# Patient Record
Sex: Female | Born: 1960
Health system: Southern US, Community
[De-identification: ages and names within clinical notes are randomized; demographics above are authoritative.]

## PROBLEM LIST (undated history)

## (undated) DIAGNOSIS — R112 Nausea with vomiting, unspecified: Secondary | ICD-10-CM

## (undated) DIAGNOSIS — T7840XA Allergy, unspecified, initial encounter: Secondary | ICD-10-CM

## (undated) DIAGNOSIS — Z86010 Personal history of colonic polyps: Secondary | ICD-10-CM

## (undated) DIAGNOSIS — F419 Anxiety disorder, unspecified: Secondary | ICD-10-CM

## (undated) DIAGNOSIS — Z9889 Other specified postprocedural states: Secondary | ICD-10-CM

## (undated) DIAGNOSIS — T8859XA Other complications of anesthesia, initial encounter: Secondary | ICD-10-CM

## (undated) DIAGNOSIS — K219 Gastro-esophageal reflux disease without esophagitis: Secondary | ICD-10-CM

## (undated) DIAGNOSIS — T4145XA Adverse effect of unspecified anesthetic, initial encounter: Secondary | ICD-10-CM

## (undated) DIAGNOSIS — Z800 Family history of malignant neoplasm of digestive organs: Secondary | ICD-10-CM

## (undated) HISTORY — DX: Anxiety disorder, unspecified: F41.9

## (undated) HISTORY — DX: Gastro-esophageal reflux disease without esophagitis: K21.9

## (undated) HISTORY — DX: Family history of malignant neoplasm of digestive organs: Z80.0

## (undated) HISTORY — DX: Allergy, unspecified, initial encounter: T78.40XA

## (undated) HISTORY — PX: NASAL SINUS SURGERY: SHX719

## (undated) HISTORY — DX: Personal history of colonic polyps: Z86.010

## (undated) HISTORY — PX: COLONOSCOPY: SHX174

---
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
## 1998-07-30 ENCOUNTER — Other Ambulatory Visit: Admission: RE | Admit: 1998-07-30 | Discharge: 1998-07-30 | Payer: Self-pay | Admitting: *Deleted

## 1998-07-30 ENCOUNTER — Encounter (INDEPENDENT_AMBULATORY_CARE_PROVIDER_SITE_OTHER): Payer: Self-pay | Admitting: Specialist

## 1998-12-24 ENCOUNTER — Other Ambulatory Visit: Admission: RE | Admit: 1998-12-24 | Discharge: 1998-12-24 | Payer: Self-pay | Admitting: *Deleted

## 1999-12-30 ENCOUNTER — Other Ambulatory Visit: Admission: RE | Admit: 1999-12-30 | Discharge: 1999-12-30 | Payer: Self-pay | Admitting: *Deleted

## 2000-12-31 ENCOUNTER — Other Ambulatory Visit: Admission: RE | Admit: 2000-12-31 | Discharge: 2000-12-31 | Payer: Self-pay | Admitting: *Deleted

## 2001-02-23 ENCOUNTER — Encounter: Admission: RE | Admit: 2001-02-23 | Discharge: 2001-02-23 | Payer: Self-pay | Admitting: *Deleted

## 2001-02-23 ENCOUNTER — Encounter: Payer: Self-pay | Admitting: *Deleted

## 2002-01-17 ENCOUNTER — Other Ambulatory Visit: Admission: RE | Admit: 2002-01-17 | Discharge: 2002-01-17 | Payer: Self-pay | Admitting: Obstetrics and Gynecology

## 2003-01-29 ENCOUNTER — Other Ambulatory Visit: Admission: RE | Admit: 2003-01-29 | Discharge: 2003-01-29 | Payer: Self-pay | Admitting: Obstetrics and Gynecology

## 2006-09-10 LAB — CONVERTED CEMR LAB: Pap Smear: NORMAL

## 2006-10-20 ENCOUNTER — Ambulatory Visit: Payer: Self-pay | Admitting: Family Medicine

## 2006-11-17 ENCOUNTER — Ambulatory Visit: Payer: Self-pay | Admitting: Gastroenterology

## 2007-03-14 ENCOUNTER — Ambulatory Visit: Payer: Self-pay | Admitting: Gastroenterology

## 2007-03-25 ENCOUNTER — Ambulatory Visit: Payer: Self-pay | Admitting: Gastroenterology

## 2007-04-21 DIAGNOSIS — J309 Allergic rhinitis, unspecified: Secondary | ICD-10-CM | POA: Insufficient documentation

## 2007-04-21 DIAGNOSIS — K648 Other hemorrhoids: Secondary | ICD-10-CM | POA: Insufficient documentation

## 2010-01-14 ENCOUNTER — Encounter
Admission: RE | Admit: 2010-01-14 | Discharge: 2010-02-04 | Payer: Self-pay | Source: Home / Self Care | Attending: Sports Medicine | Admitting: Sports Medicine

## 2010-02-04 ENCOUNTER — Encounter
Admission: RE | Admit: 2010-02-04 | Discharge: 2010-03-03 | Payer: Self-pay | Source: Home / Self Care | Attending: Sports Medicine | Admitting: Sports Medicine

## 2010-04-21 ENCOUNTER — Encounter: Payer: BC Managed Care – PPO | Admitting: Genetic Counselor

## 2010-06-24 NOTE — Assessment & Plan Note (Signed)
[facility]                         GASTROENTEROLOGY OFFICE NOTE   Lindsay Blair, Lindsay Blair                      MRN:          [id number]  DATE:11/17/2006                            DOB:          January 26, 1961    PHYSICIAN REQUESTING CONSULT:  Dr. Leanne Blair.   REASON FOR CONSULTATION:  First degree relative with colon cancer.   HISTORY OF PRESENT ILLNESS:  Lindsay Blair is a very nice 50 year old  white female referred through the courtesy of Dr. Leanne Blair. Her  gynecologist is Dr. Maxie Better. The patient's father developed  colon cancer at age 48 and passed away at age 18. Her mother developed  colon polyps in her late 43s. No other family members with colon cancer,  colon polyps, or inflammatory bowel disease. The patient relates long  term problems with intermittent mild diarrhea without rectal bleeding.  She has had no change in this chronic bowel pattern. She notes no change  in stool caliber, abdominal pain, rectal pain, weight loss, melena, or  hematochezia. She relates a history of intestinal gas and bloating in  the distant past. She has not had colonoscopy.   PAST MEDICAL HISTORY:  Allergic rhinitis  Status post sinus surgery.   CURRENT MEDICATIONS:  1. Claritin p.r.n.  2. Flonase p.r.n.  3. Allergy injections weekly.   ALLERGIES:  None known.   SOCIAL HISTORY:  Per the handwritten form.   REVIEW OF SYSTEMS:  Per the handwritten from.   PHYSICAL EXAMINATION:  GENERAL:  Well developed, well nourished white  female in no acute distress.  VITAL SIGNS:  Height 5 feet 4.5 inches, weight 150.2 pounds, blood  pressure 98/50, pulse 72 and regular.  HEENT:  Anicteric sclerae, oropharynx clear.  CHEST:  Clear to auscultation bilaterally.  CARDIAC:  Regular rate and rhythm without murmurs appreciated.  ABDOMEN:  Soft and nontender, nondistended, normal active bowel sounds.  No palpable organomegaly, masses, or hernias.  RECTAL:  Deferred to time of colonoscopy.  EXTREMITIES:  Without cyanosis, clubbing, or edema.  NEUROLOGIC:  Alert and oriented x3. Grossly non-focal.   ASSESSMENT AND PLAN:  Father with colon cancer at age 66. Mother with  colon polyps in her late 49s. Rule out colorectal neoplasms. Risks,  benefits, and alternatives to colonoscopy with possible biopsy and  possible polypectomy discussed with the patient, she consents to  proceed. This will be scheduled electively.     Venita Lick. Russella Dar, MD, [facility]  Electronically Signed    MTS/MedQ  DD: 11/17/2006  DT: 11/18/2006  Job #: [id number]   cc:   Lindsay Blair, M.D.

## 2012-02-29 ENCOUNTER — Encounter: Payer: Self-pay | Admitting: Gastroenterology

## 2012-03-03 ENCOUNTER — Encounter: Payer: Self-pay | Admitting: Gastroenterology

## 2012-06-16 ENCOUNTER — Encounter: Payer: Self-pay | Admitting: Gastroenterology

## 2012-08-09 ENCOUNTER — Ambulatory Visit (AMBULATORY_SURGERY_CENTER): Payer: BC Managed Care – PPO

## 2012-08-09 VITALS — Ht 64.5 in | Wt 167.2 lb

## 2012-08-09 DIAGNOSIS — Z800 Family history of malignant neoplasm of digestive organs: Secondary | ICD-10-CM

## 2012-08-09 MED ORDER — MOVIPREP 100 G PO SOLR: ORAL | Status: DC

## 2012-08-10 ENCOUNTER — Encounter: Payer: Self-pay | Admitting: Gastroenterology

## 2012-08-23 ENCOUNTER — Ambulatory Visit (AMBULATORY_SURGERY_CENTER): Payer: BC Managed Care – PPO | Admitting: Gastroenterology

## 2012-08-23 ENCOUNTER — Encounter: Payer: Self-pay | Admitting: Gastroenterology

## 2012-08-23 VITALS — BP 98/53 | HR 59 | Temp 97.5°F | Resp 16 | Ht 64.0 in | Wt 167.0 lb

## 2012-08-23 DIAGNOSIS — Z1211 Encounter for screening for malignant neoplasm of colon: Secondary | ICD-10-CM

## 2012-08-23 DIAGNOSIS — Z800 Family history of malignant neoplasm of digestive organs: Secondary | ICD-10-CM

## 2012-08-23 DIAGNOSIS — D126 Benign neoplasm of colon, unspecified: Secondary | ICD-10-CM

## 2012-08-23 DIAGNOSIS — Z8371 Family history of colonic polyps: Secondary | ICD-10-CM

## 2012-08-23 MED ORDER — SODIUM CHLORIDE 0.9 % IV SOLN: 500.0000 mL | INTRAVENOUS | Status: DC

## 2012-08-23 NOTE — Progress Notes (Signed)
Lidocaine-40mg IV prior to Propofol InductionPropofol given over incremental dosages 

## 2012-08-23 NOTE — Progress Notes (Signed)
Called to room to assist during endoscopic procedure.  Patient ID and intended procedure confirmed with present staff. Received instructions for my participation in the procedure from the performing physician.  

## 2012-08-23 NOTE — Op Note (Signed)
[facility] [address] [place], [address]   COLONOSCOPY PROCEDURE REPORT  PATIENT: Lindsay, Blair  MR#: [id number] BIRTHDATE: [birth year] , 51  yrs. old GENDER: Female ENDOSCOPIST: Meryl Dare, MD, [facility] PROCEDURE DATE:  08/23/2012 PROCEDURE:   Colonoscopy with biopsy ASA CLASS:   Class II INDICATIONS:elevated risk screening, Patient's immediate family history of colon cancer, Patient's family history of colon polyps, and Last colonoscopy performed 5 years ago. MEDICATIONS: MAC sedation, administered by CRNA and propofol (Diprivan) 200mg  IV DESCRIPTION OF PROCEDURE:   After the risks benefits and alternatives of the procedure were thoroughly explained, informed consent was obtained.  A digital rectal exam revealed no abnormalities of the rectum.   The LB [id number]  endoscope was introduced through the anus and advanced to the cecum, which was identified by both the appendix and ileocecal valve. No adverse events experienced.   The quality of the prep was excellent, using MoviPrep  The instrument was then slowly withdrawn as the colon was fully examined.  COLON FINDINGS: A sessile polyp measuring 4 mm in size was found at the cecum.  A polypectomy was performed with cold forceps.  The resection was complete and the polyp tissue was completely retrieved.   Mild diverticulosis was noted in the sigmoid colon. The colon was otherwise normal.  There was no diverticulosis, inflammation, polyps or cancers unless previously stated. Retroflexed views revealed small internal hemorrhoids. The time to cecum=2 minutes 30 seconds.  Withdrawal time=9 minutes 36 seconds. The scope was withdrawn and the procedure completed.  COMPLICATIONS: There were no complications.  ENDOSCOPIC IMPRESSION: 1.   Sessile polyp measuring 4 mm at the cecum; polypectomy performed with cold forceps 2.   Mild diverticulosis was noted in the sigmoid colon 3.   Small internal  hemorrhoids  RECOMMENDATIONS: 1.  Await pathology results 2.  High fiber diet with liberal fluid intake. 3.  Repeat Colonoscopy in 5 years.  eSigned:  Meryl Dare, MD, [facility] 08/23/2012 10:40 AM   cc: Maxie Better, MD

## 2012-08-23 NOTE — Patient Instructions (Addendum)
YOU HAD AN ENDOSCOPIC PROCEDURE TODAY AT THE [facility]: Refer to the procedure report that was given to you for any specific questions about what was found during the examination.  If the procedure report does not answer your questions, please call your gastroenterologist to clarify.  If you requested that your care partner not be given the details of your procedure findings, then the procedure report has been included in a sealed envelope for you to review at your convenience later.  YOU SHOULD EXPECT: Some feelings of bloating in the abdomen. Passage of more gas than usual.  Walking can help get rid of the air that was put into your GI tract during the procedure and reduce the bloating. If you had a lower endoscopy (such as a colonoscopy or flexible sigmoidoscopy) you may notice spotting of blood in your stool or on the toilet paper. If you underwent a bowel prep for your procedure, then you may not have a normal bowel movement for a few days.  DIET: Your first meal following the procedure should be a light meal and then it is ok to progress to your normal diet.  A half-sandwich or bowl of soup is an example of a good first meal.  Heavy or fried foods are harder to digest and may make you feel nauseous or bloated.  Likewise meals heavy in dairy and vegetables can cause extra gas to form and this can also increase the bloating.  Drink plenty of fluids but you should avoid alcoholic beverages for 24 hours.  ACTIVITY: Your care partner should take you home directly after the procedure.  You should plan to take it easy, moving slowly for the rest of the day.  You can resume normal activity the day after the procedure however you should NOT DRIVE or use heavy machinery for 24 hours (because of the sedation medicines used during the test).    SYMPTOMS TO REPORT IMMEDIATELY: A gastroenterologist can be reached at any hour.  During normal business hours, 8:30 AM to 5:00 PM Monday through Friday,  call [phone].  After hours and on weekends, please call the GI answering service at [phone] who will take a message and have the physician on call contact you.   Following lower endoscopy (colonoscopy or flexible sigmoidoscopy):  Excessive amounts of blood in the stool  Significant tenderness or worsening of abdominal pains  Swelling of the abdomen that is new, acute  Fever of 100F or higher   FOLLOW UP: If any biopsies were taken you will be contacted by phone or by letter within the next 1-3 weeks.  Call your gastroenterologist if you have not heard about the biopsies in 3 weeks.  Our staff will call the home number listed on your records the next business day following your procedure to check on you and address any questions or concerns that you may have at that time regarding the information given to you following your procedure. This is a courtesy call and so if there is no answer at the home number and we have not heard from you through the emergency physician on call, we will assume that you have returned to your regular daily activities without incident.  SIGNATURES/CONFIDENTIALITY: You and/or your care partner have signed paperwork which will be entered into your electronic medical record.  These signatures attest to the fact that that the information above on your After Visit Summary has been reviewed and is understood.  Full responsibility of the confidentiality of   this discharge information lies with you and/or your care-partner.    Resume medications. Information given on polyps, diverticulosis,hemorrhoids and high fiber diet with discharge instructions. 

## 2012-08-23 NOTE — Progress Notes (Signed)
Patient did not experience any of the following events: a burn prior to discharge; a fall within the facility; wrong site/side/patient/procedure/implant event; or a hospital transfer or hospital admission upon discharge from the facility. (G8907) Patient did not have preoperative order for IV antibiotic SSI prophylaxis. (G8918)  

## 2012-08-29 ENCOUNTER — Encounter: Payer: Self-pay | Admitting: Gastroenterology

## 2012-09-02 ENCOUNTER — Telehealth: Payer: Self-pay | Admitting: Gastroenterology

## 2012-09-02 NOTE — Telephone Encounter (Signed)
Reviewed results with the patient.  All questions answered.  She will call back for additional questions or concerns

## 2012-09-02 NOTE — Telephone Encounter (Signed)
Left message for patient to call back  

## 2014-09-21 ENCOUNTER — Encounter: Payer: Self-pay | Admitting: Gastroenterology

## 2014-09-21 ENCOUNTER — Telehealth: Payer: Self-pay | Admitting: Gastroenterology

## 2014-09-21 NOTE — Telephone Encounter (Signed)
Patient's sister was diagnosed at age 53 with Stave IV colon cancer.  Patient had a last colonoscopy 08/2012 and had one 4 mm colon polyps in her cecum.  Was a tubular adenoma.  She states her sisters oncologist recommend she be screened at 2 years rather than 5.  Dr. Fuller Plan please review and advise

## 2014-09-21 NOTE — Telephone Encounter (Signed)
patient states that she would like her family hx updated. she states that her sister had a colonoscopy and has been diagnosed with stage 4 colon cancer. Patient states that her last colonoscopy was in 2014 and was told by her sisters Dr that she should be checked every two years instead of five years.

## 2014-09-22 NOTE — Telephone Encounter (Signed)
Unless a genetic syndrome that is associated with CRC has been diagnosed in her sister every 2 years would be more frequent than guidelines. Was Lynch syndrome or another genetic syndrome diagnosed? Can we get records from the oncologist to determine the reason for the short interval?

## 2014-09-26 NOTE — Telephone Encounter (Signed)
She stated that it was recommended because her father was diagnosed at age 53 and now her sister at age 68.  No genetic component that she is aware of.  The surgeon that performed her sisters resection recommended an earlier than 5 year colonoscopy with her history of adenomatous polyps and 2 1st line relatives with colon cancer,

## 2014-09-26 NOTE — Telephone Encounter (Signed)
Left message for patient to call back  

## 2014-09-26 NOTE — Telephone Encounter (Signed)
With that information I would recommend a colonoscopy at a 3 year interval. If she insists we can do 2 years.

## 2014-09-26 NOTE — Telephone Encounter (Signed)
Patient notified.  Recall changed to 08/2015

## 2015-05-17 DIAGNOSIS — J301 Allergic rhinitis due to pollen: Secondary | ICD-10-CM | POA: Diagnosis not present

## 2015-05-17 DIAGNOSIS — J3089 Other allergic rhinitis: Secondary | ICD-10-CM | POA: Diagnosis not present

## 2015-05-22 DIAGNOSIS — J3089 Other allergic rhinitis: Secondary | ICD-10-CM | POA: Diagnosis not present

## 2015-05-22 DIAGNOSIS — J301 Allergic rhinitis due to pollen: Secondary | ICD-10-CM | POA: Diagnosis not present

## 2015-05-30 DIAGNOSIS — J301 Allergic rhinitis due to pollen: Secondary | ICD-10-CM | POA: Diagnosis not present

## 2015-05-30 DIAGNOSIS — J3089 Other allergic rhinitis: Secondary | ICD-10-CM | POA: Diagnosis not present

## 2015-06-03 DIAGNOSIS — J301 Allergic rhinitis due to pollen: Secondary | ICD-10-CM | POA: Diagnosis not present

## 2015-06-03 DIAGNOSIS — J3089 Other allergic rhinitis: Secondary | ICD-10-CM | POA: Diagnosis not present

## 2015-06-06 DIAGNOSIS — J301 Allergic rhinitis due to pollen: Secondary | ICD-10-CM | POA: Diagnosis not present

## 2015-06-06 DIAGNOSIS — J3089 Other allergic rhinitis: Secondary | ICD-10-CM | POA: Diagnosis not present

## 2015-06-12 DIAGNOSIS — J301 Allergic rhinitis due to pollen: Secondary | ICD-10-CM | POA: Diagnosis not present

## 2015-06-12 DIAGNOSIS — J3089 Other allergic rhinitis: Secondary | ICD-10-CM | POA: Diagnosis not present

## 2015-06-17 ENCOUNTER — Encounter: Payer: Self-pay | Admitting: Gastroenterology

## 2015-06-18 DIAGNOSIS — J3089 Other allergic rhinitis: Secondary | ICD-10-CM | POA: Diagnosis not present

## 2015-06-18 DIAGNOSIS — J301 Allergic rhinitis due to pollen: Secondary | ICD-10-CM | POA: Diagnosis not present

## 2015-06-27 DIAGNOSIS — J3089 Other allergic rhinitis: Secondary | ICD-10-CM | POA: Diagnosis not present

## 2015-06-27 DIAGNOSIS — J301 Allergic rhinitis due to pollen: Secondary | ICD-10-CM | POA: Diagnosis not present

## 2015-07-04 DIAGNOSIS — J3089 Other allergic rhinitis: Secondary | ICD-10-CM | POA: Diagnosis not present

## 2015-07-04 DIAGNOSIS — J301 Allergic rhinitis due to pollen: Secondary | ICD-10-CM | POA: Diagnosis not present

## 2015-07-11 DIAGNOSIS — J301 Allergic rhinitis due to pollen: Secondary | ICD-10-CM | POA: Diagnosis not present

## 2015-07-11 DIAGNOSIS — J3089 Other allergic rhinitis: Secondary | ICD-10-CM | POA: Diagnosis not present

## 2015-07-18 DIAGNOSIS — J3089 Other allergic rhinitis: Secondary | ICD-10-CM | POA: Diagnosis not present

## 2015-07-18 DIAGNOSIS — J301 Allergic rhinitis due to pollen: Secondary | ICD-10-CM | POA: Diagnosis not present

## 2015-07-26 DIAGNOSIS — J301 Allergic rhinitis due to pollen: Secondary | ICD-10-CM | POA: Diagnosis not present

## 2015-07-26 DIAGNOSIS — J3089 Other allergic rhinitis: Secondary | ICD-10-CM | POA: Diagnosis not present

## 2015-08-02 DIAGNOSIS — J301 Allergic rhinitis due to pollen: Secondary | ICD-10-CM | POA: Diagnosis not present

## 2015-08-02 DIAGNOSIS — J3089 Other allergic rhinitis: Secondary | ICD-10-CM | POA: Diagnosis not present

## 2015-08-08 DIAGNOSIS — J3089 Other allergic rhinitis: Secondary | ICD-10-CM | POA: Diagnosis not present

## 2015-08-08 DIAGNOSIS — J301 Allergic rhinitis due to pollen: Secondary | ICD-10-CM | POA: Diagnosis not present

## 2015-08-23 DIAGNOSIS — J301 Allergic rhinitis due to pollen: Secondary | ICD-10-CM | POA: Diagnosis not present

## 2015-08-23 DIAGNOSIS — J3089 Other allergic rhinitis: Secondary | ICD-10-CM | POA: Diagnosis not present

## 2015-08-30 ENCOUNTER — Encounter: Payer: Self-pay | Admitting: Gastroenterology

## 2015-08-30 DIAGNOSIS — J3089 Other allergic rhinitis: Secondary | ICD-10-CM | POA: Diagnosis not present

## 2015-08-30 DIAGNOSIS — J301 Allergic rhinitis due to pollen: Secondary | ICD-10-CM | POA: Diagnosis not present

## 2015-09-05 DIAGNOSIS — J301 Allergic rhinitis due to pollen: Secondary | ICD-10-CM | POA: Diagnosis not present

## 2015-09-05 DIAGNOSIS — J3089 Other allergic rhinitis: Secondary | ICD-10-CM | POA: Diagnosis not present

## 2015-09-13 DIAGNOSIS — J301 Allergic rhinitis due to pollen: Secondary | ICD-10-CM | POA: Diagnosis not present

## 2015-09-13 DIAGNOSIS — J3089 Other allergic rhinitis: Secondary | ICD-10-CM | POA: Diagnosis not present

## 2015-09-19 DIAGNOSIS — J301 Allergic rhinitis due to pollen: Secondary | ICD-10-CM | POA: Diagnosis not present

## 2015-09-19 DIAGNOSIS — J3089 Other allergic rhinitis: Secondary | ICD-10-CM | POA: Diagnosis not present

## 2015-09-24 DIAGNOSIS — J301 Allergic rhinitis due to pollen: Secondary | ICD-10-CM | POA: Diagnosis not present

## 2015-09-25 DIAGNOSIS — J3089 Other allergic rhinitis: Secondary | ICD-10-CM | POA: Diagnosis not present

## 2015-09-26 DIAGNOSIS — J3089 Other allergic rhinitis: Secondary | ICD-10-CM | POA: Diagnosis not present

## 2015-09-26 DIAGNOSIS — J301 Allergic rhinitis due to pollen: Secondary | ICD-10-CM | POA: Diagnosis not present

## 2015-09-27 ENCOUNTER — Encounter: Payer: Self-pay | Admitting: Gastroenterology

## 2015-10-03 DIAGNOSIS — J3089 Other allergic rhinitis: Secondary | ICD-10-CM | POA: Diagnosis not present

## 2015-10-03 DIAGNOSIS — J301 Allergic rhinitis due to pollen: Secondary | ICD-10-CM | POA: Diagnosis not present

## 2015-10-11 DIAGNOSIS — J3089 Other allergic rhinitis: Secondary | ICD-10-CM | POA: Diagnosis not present

## 2015-10-11 DIAGNOSIS — J301 Allergic rhinitis due to pollen: Secondary | ICD-10-CM | POA: Diagnosis not present

## 2015-10-16 DIAGNOSIS — J3081 Allergic rhinitis due to animal (cat) (dog) hair and dander: Secondary | ICD-10-CM | POA: Diagnosis not present

## 2015-10-16 DIAGNOSIS — J3089 Other allergic rhinitis: Secondary | ICD-10-CM | POA: Diagnosis not present

## 2015-10-16 DIAGNOSIS — J301 Allergic rhinitis due to pollen: Secondary | ICD-10-CM | POA: Diagnosis not present

## 2015-10-16 DIAGNOSIS — H1045 Other chronic allergic conjunctivitis: Secondary | ICD-10-CM | POA: Diagnosis not present

## 2015-10-25 DIAGNOSIS — J3089 Other allergic rhinitis: Secondary | ICD-10-CM | POA: Diagnosis not present

## 2015-10-25 DIAGNOSIS — J301 Allergic rhinitis due to pollen: Secondary | ICD-10-CM | POA: Diagnosis not present

## 2015-10-29 ENCOUNTER — Encounter: Payer: Self-pay | Admitting: Gastroenterology

## 2015-10-30 DIAGNOSIS — J3089 Other allergic rhinitis: Secondary | ICD-10-CM | POA: Diagnosis not present

## 2015-10-30 DIAGNOSIS — J301 Allergic rhinitis due to pollen: Secondary | ICD-10-CM | POA: Diagnosis not present

## 2015-11-04 DIAGNOSIS — J3089 Other allergic rhinitis: Secondary | ICD-10-CM | POA: Diagnosis not present

## 2015-11-04 DIAGNOSIS — J301 Allergic rhinitis due to pollen: Secondary | ICD-10-CM | POA: Diagnosis not present

## 2015-11-06 DIAGNOSIS — J301 Allergic rhinitis due to pollen: Secondary | ICD-10-CM | POA: Diagnosis not present

## 2015-11-06 DIAGNOSIS — J3089 Other allergic rhinitis: Secondary | ICD-10-CM | POA: Diagnosis not present

## 2015-11-11 DIAGNOSIS — J3089 Other allergic rhinitis: Secondary | ICD-10-CM | POA: Diagnosis not present

## 2015-11-11 DIAGNOSIS — J301 Allergic rhinitis due to pollen: Secondary | ICD-10-CM | POA: Diagnosis not present

## 2015-11-14 DIAGNOSIS — J3089 Other allergic rhinitis: Secondary | ICD-10-CM | POA: Diagnosis not present

## 2015-11-14 DIAGNOSIS — J301 Allergic rhinitis due to pollen: Secondary | ICD-10-CM | POA: Diagnosis not present

## 2015-11-18 ENCOUNTER — Ambulatory Visit (AMBULATORY_SURGERY_CENTER): Payer: Self-pay | Admitting: *Deleted

## 2015-11-18 VITALS — Ht 64.5 in | Wt 161.0 lb

## 2015-11-18 DIAGNOSIS — Z86010 Personal history of colonic polyps: Secondary | ICD-10-CM

## 2015-11-18 MED ORDER — NA SULFATE-K SULFATE-MG SULF 17.5-3.13-1.6 GM/177ML PO SOLN: 1.0000 | Freq: Once | ORAL | 0 refills | Status: AC

## 2015-11-18 MED ORDER — NA SULFATE-K SULFATE-MG SULF 17.5-3.13-1.6 GM/177ML PO SOLN: 1.0000 | Freq: Once | ORAL | 0 refills | Status: DC

## 2015-11-18 NOTE — Progress Notes (Signed)
No egg or soy allergy. No anesthesia problems.  No home O2.  No diet meds.  

## 2015-11-21 ENCOUNTER — Encounter: Payer: Self-pay | Admitting: Gastroenterology

## 2015-11-21 DIAGNOSIS — J301 Allergic rhinitis due to pollen: Secondary | ICD-10-CM | POA: Diagnosis not present

## 2015-11-21 DIAGNOSIS — J3089 Other allergic rhinitis: Secondary | ICD-10-CM | POA: Diagnosis not present

## 2015-11-28 DIAGNOSIS — J3089 Other allergic rhinitis: Secondary | ICD-10-CM | POA: Diagnosis not present

## 2015-11-28 DIAGNOSIS — J301 Allergic rhinitis due to pollen: Secondary | ICD-10-CM | POA: Diagnosis not present

## 2015-12-03 ENCOUNTER — Ambulatory Visit (AMBULATORY_SURGERY_CENTER): Payer: BLUE CROSS/BLUE SHIELD | Admitting: Gastroenterology

## 2015-12-03 ENCOUNTER — Encounter: Payer: Self-pay | Admitting: Gastroenterology

## 2015-12-03 VITALS — BP 92/56 | HR 57 | Temp 98.0°F | Resp 12 | Ht 64.0 in | Wt 161.0 lb

## 2015-12-03 DIAGNOSIS — Z800 Family history of malignant neoplasm of digestive organs: Secondary | ICD-10-CM

## 2015-12-03 DIAGNOSIS — Z1211 Encounter for screening for malignant neoplasm of colon: Secondary | ICD-10-CM | POA: Diagnosis not present

## 2015-12-03 DIAGNOSIS — Z86010 Personal history of colonic polyps: Secondary | ICD-10-CM

## 2015-12-03 DIAGNOSIS — D122 Benign neoplasm of ascending colon: Secondary | ICD-10-CM

## 2015-12-03 MED ORDER — SODIUM CHLORIDE 0.9 % IV SOLN: 500.0000 mL | INTRAVENOUS | Status: DC

## 2015-12-03 NOTE — Progress Notes (Signed)
Called to room to assist during endoscopic procedure.  Patient ID and intended procedure confirmed with present staff. Received instructions for my participation in the procedure from the performing physician.  

## 2015-12-03 NOTE — Patient Instructions (Signed)
Impression/Recommendations:  Polyp handout given to patient. Diverticulosis handout given to patient.  Repeat colonoscopy in 5 years for surveillance.YOU HAD AN ENDOSCOPIC PROCEDURE TODAY AT [facility]:   Refer to the procedure report that was given to you for any specific questions about what was found during the examination.  If the procedure report does not answer your questions, please call your gastroenterologist to clarify.  If you requested that your care partner not be given the details of your procedure findings, then the procedure report has been included in a sealed envelope for you to review at your convenience later.  YOU SHOULD EXPECT: Some feelings of bloating in the abdomen. Passage of more gas than usual.  Walking can help get rid of the air that was put into your GI tract during the procedure and reduce the bloating. If you had a lower endoscopy (such as a colonoscopy or flexible sigmoidoscopy) you may notice spotting of blood in your stool or on the toilet paper. If you underwent a bowel prep for your procedure, you may not have a normal bowel movement for a few days.  Please Note:  You might notice some irritation and congestion in your nose or some drainage.  This is from the oxygen used during your procedure.  There is no need for concern and it should clear up in a day or so.  SYMPTOMS TO REPORT IMMEDIATELY:   Following lower endoscopy (colonoscopy or flexible sigmoidoscopy):  Excessive amounts of blood in the stool  Significant tenderness or worsening of abdominal pains  Swelling of the abdomen that is new, acute  Fever of 100F or higher  For urgent or emergent issues, a gastroenterologist can be reached at any hour by calling [phone].   DIET:  We do recommend a small meal at first, but then you may proceed to your regular diet.  Drink plenty of fluids but you should avoid alcoholic beverages for 24 hours.  ACTIVITY:  You should plan to take  it easy for the rest of today and you should NOT DRIVE or use heavy machinery until tomorrow (because of the sedation medicines used during the test).    FOLLOW UP: Our staff will call the number listed on your records the next business day following your procedure to check on you and address any questions or concerns that you may have regarding the information given to you following your procedure. If we do not reach you, we will leave a message.  However, if you are feeling well and you are not experiencing any problems, there is no need to return our call.  We will assume that you have returned to your regular daily activities without incident.  If any biopsies were taken you will be contacted by phone or by letter within the next 1-3 weeks.  Please call us at [phone] if you have not heard about the biopsies in 3 weeks.    SIGNATURES/CONFIDENTIALITY: You and/or your care partner have signed paperwork which will be entered into your electronic medical record.  These signatures attest to the fact that that the information above on your After Visit Summary has been reviewed and is understood.  Full responsibility of the confidentiality of this discharge information lies with you and/or your care-partner.

## 2015-12-03 NOTE — Progress Notes (Signed)
Report given to PACU RN, vss 

## 2015-12-03 NOTE — Op Note (Signed)
[facility] Patient Name: Lindsay Blair Procedure Date: 12/03/2015 11:28 AM MRN: [id number] Endoscopist: Ladene Artist , MD Age: 55 Referring MD:  Date of Birth: 08-16-1960 Gender: Female Account #: [id number] Procedure:                Colonoscopy Indications:              Screening in patient at increased risk: Family                            history of 1st-degree relative with colorectal                            cancer, Surveillance: Personal history of                            adenomatous polyps on last colonoscopy > 3 years ago Medicines:                Monitored Anesthesia Care Procedure:                Pre-Anesthesia Assessment:                           - Prior to the procedure, a History and Physical                            was performed, and patient medications and                            allergies were reviewed. The patient's tolerance of                            previous anesthesia was also reviewed. The risks                            and benefits of the procedure and the sedation                            options and risks were discussed with the patient.                            All questions were answered, and informed consent                            was obtained. Prior Anticoagulants: The patient has                            taken no previous anticoagulant or antiplatelet                            agents. ASA Grade Assessment: II - A patient with                            mild systemic disease. After reviewing the risks  and benefits, the patient was deemed in                            satisfactory condition to undergo the procedure.                           After obtaining informed consent, the colonoscope                            was passed under direct vision. Throughout the                            procedure, the patient's blood pressure, pulse, and                            oxygen saturations  were monitored continuously. The                            Model [id number]) scope was introduced                            through the anus and advanced to the the cecum,                            identified by appendiceal orifice and ileocecal                            valve. The ileocecal valve, appendiceal orifice,                            and rectum were photographed. The quality of the                            bowel preparation was good. The colonoscopy was                            performed without difficulty. The patient tolerated                            the procedure well. Scope In: 11:37:41 AM Scope Out: 11:48:35 AM Scope Withdrawal Time: 0 hours 8 minutes 46 seconds  Total Procedure Duration: 0 hours 10 minutes 54 seconds  Findings:                 The perianal and digital rectal examinations were                            normal.                           A 4 mm polyp was found in the ascending colon. The                            polyp was sessile. The polyp was removed with a  cold biopsy forceps. Resection and retrieval were                            complete.                           A few small-mouthed diverticula were found in the                            sigmoid colon.                           The exam was otherwise without abnormality on                            direct and retroflexion views. Complications:            No immediate complications. Estimated blood loss:                            None. Estimated Blood Loss:     Estimated blood loss: none. Impression:               - One 4 mm polyp in the ascending colon, removed                            with a cold biopsy forceps. Resected and retrieved.                           - Diverticulosis in the sigmoid colon.                           - The examination was otherwise normal on direct                            and retroflexion views. Recommendation:            - Repeat colonoscopy in 5 years for surveillance.                           - Patient has a contact number available for                            emergencies. The signs and symptoms of potential                            delayed complications were discussed with the                            patient. Return to normal activities tomorrow.                            Written discharge instructions were provided to the                            patient.                           -  Resume previous diet.                           - Continue present medications.                           - Await pathology results. Ladene Artist, MD 12/03/2015 11:51:41 AM This report has been signed electronically.

## 2015-12-04 ENCOUNTER — Telehealth: Payer: Self-pay | Admitting: *Deleted

## 2015-12-04 NOTE — Telephone Encounter (Signed)
  Follow up Call-  Call back number 12/03/2015  Post procedure Call Back phone  # [phone]  Permission to leave phone message Yes  Some recent data might be hidden     Patient questions:  Do you have a fever, pain , or abdominal swelling? No. Pain Score  0 *  Have you tolerated food without any problems? Yes.    Have you been able to return to your normal activities? Yes.    Do you have any questions about your discharge instructions: Diet   No. Medications  No. Follow up visit  No.  Do you have questions or concerns about your Care? No.  Actions: * If pain score is 4 or above: No action needed, pain <4.

## 2015-12-04 NOTE — Telephone Encounter (Signed)
Message left

## 2015-12-05 DIAGNOSIS — J301 Allergic rhinitis due to pollen: Secondary | ICD-10-CM | POA: Diagnosis not present

## 2015-12-05 DIAGNOSIS — J3089 Other allergic rhinitis: Secondary | ICD-10-CM | POA: Diagnosis not present

## 2015-12-06 DIAGNOSIS — M84375A Stress fracture, left foot, initial encounter for fracture: Secondary | ICD-10-CM | POA: Diagnosis not present

## 2015-12-13 DIAGNOSIS — J3089 Other allergic rhinitis: Secondary | ICD-10-CM | POA: Diagnosis not present

## 2015-12-13 DIAGNOSIS — J301 Allergic rhinitis due to pollen: Secondary | ICD-10-CM | POA: Diagnosis not present

## 2015-12-18 ENCOUNTER — Encounter: Payer: Self-pay | Admitting: Gastroenterology

## 2015-12-18 DIAGNOSIS — M84375D Stress fracture, left foot, subsequent encounter for fracture with routine healing: Secondary | ICD-10-CM | POA: Diagnosis not present

## 2015-12-19 DIAGNOSIS — J301 Allergic rhinitis due to pollen: Secondary | ICD-10-CM | POA: Diagnosis not present

## 2015-12-19 DIAGNOSIS — J3089 Other allergic rhinitis: Secondary | ICD-10-CM | POA: Diagnosis not present

## 2015-12-26 DIAGNOSIS — J3089 Other allergic rhinitis: Secondary | ICD-10-CM | POA: Diagnosis not present

## 2015-12-26 DIAGNOSIS — J301 Allergic rhinitis due to pollen: Secondary | ICD-10-CM | POA: Diagnosis not present

## 2015-12-31 DIAGNOSIS — J301 Allergic rhinitis due to pollen: Secondary | ICD-10-CM | POA: Diagnosis not present

## 2015-12-31 DIAGNOSIS — J3089 Other allergic rhinitis: Secondary | ICD-10-CM | POA: Diagnosis not present

## 2016-01-08 DIAGNOSIS — M84375D Stress fracture, left foot, subsequent encounter for fracture with routine healing: Secondary | ICD-10-CM | POA: Diagnosis not present

## 2016-01-08 DIAGNOSIS — M65972 Unspecified synovitis and tenosynovitis, left ankle and foot: Secondary | ICD-10-CM | POA: Insufficient documentation

## 2016-01-08 DIAGNOSIS — M659 Synovitis and tenosynovitis, unspecified: Secondary | ICD-10-CM | POA: Insufficient documentation

## 2016-01-09 DIAGNOSIS — J301 Allergic rhinitis due to pollen: Secondary | ICD-10-CM | POA: Diagnosis not present

## 2016-01-09 DIAGNOSIS — J3089 Other allergic rhinitis: Secondary | ICD-10-CM | POA: Diagnosis not present

## 2016-01-13 DIAGNOSIS — Z1231 Encounter for screening mammogram for malignant neoplasm of breast: Secondary | ICD-10-CM | POA: Diagnosis not present

## 2016-01-13 DIAGNOSIS — Z803 Family history of malignant neoplasm of breast: Secondary | ICD-10-CM | POA: Diagnosis not present

## 2016-01-22 DIAGNOSIS — Z113 Encounter for screening for infections with a predominantly sexual mode of transmission: Secondary | ICD-10-CM | POA: Diagnosis not present

## 2016-01-22 DIAGNOSIS — Z01419 Encounter for gynecological examination (general) (routine) without abnormal findings: Secondary | ICD-10-CM | POA: Diagnosis not present

## 2016-01-22 DIAGNOSIS — Z6827 Body mass index (BMI) 27.0-27.9, adult: Secondary | ICD-10-CM | POA: Diagnosis not present

## 2016-01-22 DIAGNOSIS — Z1151 Encounter for screening for human papillomavirus (HPV): Secondary | ICD-10-CM | POA: Diagnosis not present

## 2016-01-23 DIAGNOSIS — J3089 Other allergic rhinitis: Secondary | ICD-10-CM | POA: Diagnosis not present

## 2016-01-23 DIAGNOSIS — J301 Allergic rhinitis due to pollen: Secondary | ICD-10-CM | POA: Diagnosis not present

## 2016-01-27 DIAGNOSIS — M84375D Stress fracture, left foot, subsequent encounter for fracture with routine healing: Secondary | ICD-10-CM | POA: Diagnosis not present

## 2016-01-27 DIAGNOSIS — M659 Synovitis and tenosynovitis, unspecified: Secondary | ICD-10-CM | POA: Diagnosis not present

## 2016-01-30 DIAGNOSIS — J301 Allergic rhinitis due to pollen: Secondary | ICD-10-CM | POA: Diagnosis not present

## 2016-01-30 DIAGNOSIS — J3089 Other allergic rhinitis: Secondary | ICD-10-CM | POA: Diagnosis not present

## 2016-02-06 DIAGNOSIS — J301 Allergic rhinitis due to pollen: Secondary | ICD-10-CM | POA: Diagnosis not present

## 2016-02-06 DIAGNOSIS — J3089 Other allergic rhinitis: Secondary | ICD-10-CM | POA: Diagnosis not present

## 2016-02-14 DIAGNOSIS — J3089 Other allergic rhinitis: Secondary | ICD-10-CM | POA: Diagnosis not present

## 2016-02-14 DIAGNOSIS — J301 Allergic rhinitis due to pollen: Secondary | ICD-10-CM | POA: Diagnosis not present

## 2016-02-20 DIAGNOSIS — J3089 Other allergic rhinitis: Secondary | ICD-10-CM | POA: Diagnosis not present

## 2016-02-20 DIAGNOSIS — J301 Allergic rhinitis due to pollen: Secondary | ICD-10-CM | POA: Diagnosis not present

## 2016-02-25 DIAGNOSIS — H5203 Hypermetropia, bilateral: Secondary | ICD-10-CM | POA: Diagnosis not present

## 2016-02-28 DIAGNOSIS — J3089 Other allergic rhinitis: Secondary | ICD-10-CM | POA: Diagnosis not present

## 2016-02-28 DIAGNOSIS — J301 Allergic rhinitis due to pollen: Secondary | ICD-10-CM | POA: Diagnosis not present

## 2016-03-05 DIAGNOSIS — J3089 Other allergic rhinitis: Secondary | ICD-10-CM | POA: Diagnosis not present

## 2016-03-05 DIAGNOSIS — J301 Allergic rhinitis due to pollen: Secondary | ICD-10-CM | POA: Diagnosis not present

## 2016-03-12 DIAGNOSIS — J3089 Other allergic rhinitis: Secondary | ICD-10-CM | POA: Diagnosis not present

## 2016-03-12 DIAGNOSIS — J301 Allergic rhinitis due to pollen: Secondary | ICD-10-CM | POA: Diagnosis not present

## 2016-03-19 DIAGNOSIS — J301 Allergic rhinitis due to pollen: Secondary | ICD-10-CM | POA: Diagnosis not present

## 2016-03-19 DIAGNOSIS — J3089 Other allergic rhinitis: Secondary | ICD-10-CM | POA: Diagnosis not present

## 2016-03-27 DIAGNOSIS — J301 Allergic rhinitis due to pollen: Secondary | ICD-10-CM | POA: Diagnosis not present

## 2016-03-27 DIAGNOSIS — J3089 Other allergic rhinitis: Secondary | ICD-10-CM | POA: Diagnosis not present

## 2016-04-01 DIAGNOSIS — J3089 Other allergic rhinitis: Secondary | ICD-10-CM | POA: Diagnosis not present

## 2016-04-01 DIAGNOSIS — J301 Allergic rhinitis due to pollen: Secondary | ICD-10-CM | POA: Diagnosis not present

## 2016-04-03 DIAGNOSIS — J3089 Other allergic rhinitis: Secondary | ICD-10-CM | POA: Diagnosis not present

## 2016-04-03 DIAGNOSIS — J301 Allergic rhinitis due to pollen: Secondary | ICD-10-CM | POA: Diagnosis not present

## 2016-04-06 DIAGNOSIS — J301 Allergic rhinitis due to pollen: Secondary | ICD-10-CM | POA: Diagnosis not present

## 2016-04-06 DIAGNOSIS — J3089 Other allergic rhinitis: Secondary | ICD-10-CM | POA: Diagnosis not present

## 2016-04-22 DIAGNOSIS — J301 Allergic rhinitis due to pollen: Secondary | ICD-10-CM | POA: Diagnosis not present

## 2016-04-22 DIAGNOSIS — J3089 Other allergic rhinitis: Secondary | ICD-10-CM | POA: Diagnosis not present

## 2016-04-24 DIAGNOSIS — J3089 Other allergic rhinitis: Secondary | ICD-10-CM | POA: Diagnosis not present

## 2016-04-24 DIAGNOSIS — J301 Allergic rhinitis due to pollen: Secondary | ICD-10-CM | POA: Diagnosis not present

## 2016-04-30 DIAGNOSIS — J301 Allergic rhinitis due to pollen: Secondary | ICD-10-CM | POA: Diagnosis not present

## 2016-04-30 DIAGNOSIS — J3089 Other allergic rhinitis: Secondary | ICD-10-CM | POA: Diagnosis not present

## 2016-05-07 DIAGNOSIS — J301 Allergic rhinitis due to pollen: Secondary | ICD-10-CM | POA: Diagnosis not present

## 2016-05-07 DIAGNOSIS — J3081 Allergic rhinitis due to animal (cat) (dog) hair and dander: Secondary | ICD-10-CM | POA: Diagnosis not present

## 2016-05-07 DIAGNOSIS — J3089 Other allergic rhinitis: Secondary | ICD-10-CM | POA: Diagnosis not present

## 2016-05-14 DIAGNOSIS — J3089 Other allergic rhinitis: Secondary | ICD-10-CM | POA: Diagnosis not present

## 2016-05-14 DIAGNOSIS — J301 Allergic rhinitis due to pollen: Secondary | ICD-10-CM | POA: Diagnosis not present

## 2016-05-21 DIAGNOSIS — J301 Allergic rhinitis due to pollen: Secondary | ICD-10-CM | POA: Diagnosis not present

## 2016-05-21 DIAGNOSIS — J3089 Other allergic rhinitis: Secondary | ICD-10-CM | POA: Diagnosis not present

## 2016-05-28 DIAGNOSIS — J3089 Other allergic rhinitis: Secondary | ICD-10-CM | POA: Diagnosis not present

## 2016-05-28 DIAGNOSIS — J301 Allergic rhinitis due to pollen: Secondary | ICD-10-CM | POA: Diagnosis not present

## 2016-05-29 DIAGNOSIS — J301 Allergic rhinitis due to pollen: Secondary | ICD-10-CM | POA: Diagnosis not present

## 2016-06-01 DIAGNOSIS — J3089 Other allergic rhinitis: Secondary | ICD-10-CM | POA: Diagnosis not present

## 2016-06-04 DIAGNOSIS — J3089 Other allergic rhinitis: Secondary | ICD-10-CM | POA: Diagnosis not present

## 2016-06-04 DIAGNOSIS — J301 Allergic rhinitis due to pollen: Secondary | ICD-10-CM | POA: Diagnosis not present

## 2016-06-11 DIAGNOSIS — J301 Allergic rhinitis due to pollen: Secondary | ICD-10-CM | POA: Diagnosis not present

## 2016-06-11 DIAGNOSIS — J3089 Other allergic rhinitis: Secondary | ICD-10-CM | POA: Diagnosis not present

## 2016-06-18 DIAGNOSIS — J3089 Other allergic rhinitis: Secondary | ICD-10-CM | POA: Diagnosis not present

## 2016-06-18 DIAGNOSIS — J301 Allergic rhinitis due to pollen: Secondary | ICD-10-CM | POA: Diagnosis not present

## 2016-06-22 DIAGNOSIS — J3089 Other allergic rhinitis: Secondary | ICD-10-CM | POA: Diagnosis not present

## 2016-06-22 DIAGNOSIS — J301 Allergic rhinitis due to pollen: Secondary | ICD-10-CM | POA: Diagnosis not present

## 2016-06-25 DIAGNOSIS — J301 Allergic rhinitis due to pollen: Secondary | ICD-10-CM | POA: Diagnosis not present

## 2016-06-25 DIAGNOSIS — J3089 Other allergic rhinitis: Secondary | ICD-10-CM | POA: Diagnosis not present

## 2016-06-29 DIAGNOSIS — J3089 Other allergic rhinitis: Secondary | ICD-10-CM | POA: Diagnosis not present

## 2016-06-29 DIAGNOSIS — J301 Allergic rhinitis due to pollen: Secondary | ICD-10-CM | POA: Diagnosis not present

## 2016-07-02 DIAGNOSIS — J301 Allergic rhinitis due to pollen: Secondary | ICD-10-CM | POA: Diagnosis not present

## 2016-07-02 DIAGNOSIS — J3089 Other allergic rhinitis: Secondary | ICD-10-CM | POA: Diagnosis not present

## 2016-07-09 DIAGNOSIS — J301 Allergic rhinitis due to pollen: Secondary | ICD-10-CM | POA: Diagnosis not present

## 2016-07-09 DIAGNOSIS — J3089 Other allergic rhinitis: Secondary | ICD-10-CM | POA: Diagnosis not present

## 2016-07-15 DIAGNOSIS — J3089 Other allergic rhinitis: Secondary | ICD-10-CM | POA: Diagnosis not present

## 2016-07-15 DIAGNOSIS — J301 Allergic rhinitis due to pollen: Secondary | ICD-10-CM | POA: Diagnosis not present

## 2016-07-24 DIAGNOSIS — J3089 Other allergic rhinitis: Secondary | ICD-10-CM | POA: Diagnosis not present

## 2016-07-24 DIAGNOSIS — J301 Allergic rhinitis due to pollen: Secondary | ICD-10-CM | POA: Diagnosis not present

## 2016-07-30 DIAGNOSIS — J301 Allergic rhinitis due to pollen: Secondary | ICD-10-CM | POA: Diagnosis not present

## 2016-07-30 DIAGNOSIS — J3089 Other allergic rhinitis: Secondary | ICD-10-CM | POA: Diagnosis not present

## 2016-08-06 DIAGNOSIS — J301 Allergic rhinitis due to pollen: Secondary | ICD-10-CM | POA: Diagnosis not present

## 2016-08-06 DIAGNOSIS — J3089 Other allergic rhinitis: Secondary | ICD-10-CM | POA: Diagnosis not present

## 2016-08-13 DIAGNOSIS — J301 Allergic rhinitis due to pollen: Secondary | ICD-10-CM | POA: Diagnosis not present

## 2016-08-13 DIAGNOSIS — J3089 Other allergic rhinitis: Secondary | ICD-10-CM | POA: Diagnosis not present

## 2016-08-20 DIAGNOSIS — J301 Allergic rhinitis due to pollen: Secondary | ICD-10-CM | POA: Diagnosis not present

## 2016-08-20 DIAGNOSIS — J3089 Other allergic rhinitis: Secondary | ICD-10-CM | POA: Diagnosis not present

## 2016-08-27 DIAGNOSIS — J3089 Other allergic rhinitis: Secondary | ICD-10-CM | POA: Diagnosis not present

## 2016-08-27 DIAGNOSIS — J301 Allergic rhinitis due to pollen: Secondary | ICD-10-CM | POA: Diagnosis not present

## 2016-09-03 DIAGNOSIS — J3089 Other allergic rhinitis: Secondary | ICD-10-CM | POA: Diagnosis not present

## 2016-09-03 DIAGNOSIS — J301 Allergic rhinitis due to pollen: Secondary | ICD-10-CM | POA: Diagnosis not present

## 2016-09-10 DIAGNOSIS — J301 Allergic rhinitis due to pollen: Secondary | ICD-10-CM | POA: Diagnosis not present

## 2016-09-10 DIAGNOSIS — J3089 Other allergic rhinitis: Secondary | ICD-10-CM | POA: Diagnosis not present

## 2016-09-17 DIAGNOSIS — J301 Allergic rhinitis due to pollen: Secondary | ICD-10-CM | POA: Diagnosis not present

## 2016-09-17 DIAGNOSIS — J3089 Other allergic rhinitis: Secondary | ICD-10-CM | POA: Diagnosis not present

## 2016-09-25 DIAGNOSIS — J3089 Other allergic rhinitis: Secondary | ICD-10-CM | POA: Diagnosis not present

## 2016-09-25 DIAGNOSIS — J301 Allergic rhinitis due to pollen: Secondary | ICD-10-CM | POA: Diagnosis not present

## 2016-10-01 DIAGNOSIS — J301 Allergic rhinitis due to pollen: Secondary | ICD-10-CM | POA: Diagnosis not present

## 2016-10-01 DIAGNOSIS — J3089 Other allergic rhinitis: Secondary | ICD-10-CM | POA: Diagnosis not present

## 2016-10-08 DIAGNOSIS — J3089 Other allergic rhinitis: Secondary | ICD-10-CM | POA: Diagnosis not present

## 2016-10-08 DIAGNOSIS — J301 Allergic rhinitis due to pollen: Secondary | ICD-10-CM | POA: Diagnosis not present

## 2016-10-15 DIAGNOSIS — J3089 Other allergic rhinitis: Secondary | ICD-10-CM | POA: Diagnosis not present

## 2016-10-15 DIAGNOSIS — H1045 Other chronic allergic conjunctivitis: Secondary | ICD-10-CM | POA: Diagnosis not present

## 2016-10-15 DIAGNOSIS — J3081 Allergic rhinitis due to animal (cat) (dog) hair and dander: Secondary | ICD-10-CM | POA: Diagnosis not present

## 2016-10-15 DIAGNOSIS — J301 Allergic rhinitis due to pollen: Secondary | ICD-10-CM | POA: Diagnosis not present

## 2016-10-21 DIAGNOSIS — J3089 Other allergic rhinitis: Secondary | ICD-10-CM | POA: Diagnosis not present

## 2016-10-21 DIAGNOSIS — J301 Allergic rhinitis due to pollen: Secondary | ICD-10-CM | POA: Diagnosis not present

## 2016-10-22 DIAGNOSIS — J301 Allergic rhinitis due to pollen: Secondary | ICD-10-CM | POA: Diagnosis not present

## 2016-10-23 DIAGNOSIS — J3089 Other allergic rhinitis: Secondary | ICD-10-CM | POA: Diagnosis not present

## 2016-10-28 DIAGNOSIS — J3089 Other allergic rhinitis: Secondary | ICD-10-CM | POA: Diagnosis not present

## 2016-10-28 DIAGNOSIS — J301 Allergic rhinitis due to pollen: Secondary | ICD-10-CM | POA: Diagnosis not present

## 2016-11-03 DIAGNOSIS — J301 Allergic rhinitis due to pollen: Secondary | ICD-10-CM | POA: Diagnosis not present

## 2016-11-03 DIAGNOSIS — J3089 Other allergic rhinitis: Secondary | ICD-10-CM | POA: Diagnosis not present

## 2016-11-05 DIAGNOSIS — J301 Allergic rhinitis due to pollen: Secondary | ICD-10-CM | POA: Diagnosis not present

## 2016-11-05 DIAGNOSIS — J3089 Other allergic rhinitis: Secondary | ICD-10-CM | POA: Diagnosis not present

## 2016-11-10 DIAGNOSIS — J301 Allergic rhinitis due to pollen: Secondary | ICD-10-CM | POA: Diagnosis not present

## 2016-11-10 DIAGNOSIS — J3089 Other allergic rhinitis: Secondary | ICD-10-CM | POA: Diagnosis not present

## 2016-11-12 DIAGNOSIS — J301 Allergic rhinitis due to pollen: Secondary | ICD-10-CM | POA: Diagnosis not present

## 2016-11-12 DIAGNOSIS — J3089 Other allergic rhinitis: Secondary | ICD-10-CM | POA: Diagnosis not present

## 2016-11-17 DIAGNOSIS — J3089 Other allergic rhinitis: Secondary | ICD-10-CM | POA: Diagnosis not present

## 2016-11-17 DIAGNOSIS — J301 Allergic rhinitis due to pollen: Secondary | ICD-10-CM | POA: Diagnosis not present

## 2016-11-19 DIAGNOSIS — J3089 Other allergic rhinitis: Secondary | ICD-10-CM | POA: Diagnosis not present

## 2016-11-25 DIAGNOSIS — J301 Allergic rhinitis due to pollen: Secondary | ICD-10-CM | POA: Diagnosis not present

## 2016-11-25 DIAGNOSIS — J3089 Other allergic rhinitis: Secondary | ICD-10-CM | POA: Diagnosis not present

## 2016-12-02 DIAGNOSIS — J3089 Other allergic rhinitis: Secondary | ICD-10-CM | POA: Diagnosis not present

## 2016-12-02 DIAGNOSIS — J301 Allergic rhinitis due to pollen: Secondary | ICD-10-CM | POA: Diagnosis not present

## 2016-12-08 DIAGNOSIS — J301 Allergic rhinitis due to pollen: Secondary | ICD-10-CM | POA: Diagnosis not present

## 2016-12-08 DIAGNOSIS — J3089 Other allergic rhinitis: Secondary | ICD-10-CM | POA: Diagnosis not present

## 2016-12-10 DIAGNOSIS — J3089 Other allergic rhinitis: Secondary | ICD-10-CM | POA: Diagnosis not present

## 2016-12-10 DIAGNOSIS — J301 Allergic rhinitis due to pollen: Secondary | ICD-10-CM | POA: Diagnosis not present

## 2016-12-18 DIAGNOSIS — J3089 Other allergic rhinitis: Secondary | ICD-10-CM | POA: Diagnosis not present

## 2016-12-18 DIAGNOSIS — J301 Allergic rhinitis due to pollen: Secondary | ICD-10-CM | POA: Diagnosis not present

## 2016-12-23 DIAGNOSIS — J3089 Other allergic rhinitis: Secondary | ICD-10-CM | POA: Diagnosis not present

## 2016-12-23 DIAGNOSIS — J301 Allergic rhinitis due to pollen: Secondary | ICD-10-CM | POA: Diagnosis not present

## 2016-12-29 DIAGNOSIS — J301 Allergic rhinitis due to pollen: Secondary | ICD-10-CM | POA: Diagnosis not present

## 2016-12-29 DIAGNOSIS — J3089 Other allergic rhinitis: Secondary | ICD-10-CM | POA: Diagnosis not present

## 2017-01-07 DIAGNOSIS — J301 Allergic rhinitis due to pollen: Secondary | ICD-10-CM | POA: Diagnosis not present

## 2017-01-07 DIAGNOSIS — J3089 Other allergic rhinitis: Secondary | ICD-10-CM | POA: Diagnosis not present

## 2017-01-12 DIAGNOSIS — Z1231 Encounter for screening mammogram for malignant neoplasm of breast: Secondary | ICD-10-CM | POA: Diagnosis not present

## 2017-01-12 DIAGNOSIS — Z803 Family history of malignant neoplasm of breast: Secondary | ICD-10-CM | POA: Diagnosis not present

## 2017-01-14 DIAGNOSIS — J3081 Allergic rhinitis due to animal (cat) (dog) hair and dander: Secondary | ICD-10-CM | POA: Diagnosis not present

## 2017-01-14 DIAGNOSIS — J3089 Other allergic rhinitis: Secondary | ICD-10-CM | POA: Diagnosis not present

## 2017-01-14 DIAGNOSIS — J301 Allergic rhinitis due to pollen: Secondary | ICD-10-CM | POA: Diagnosis not present

## 2017-01-21 DIAGNOSIS — J3089 Other allergic rhinitis: Secondary | ICD-10-CM | POA: Diagnosis not present

## 2017-01-21 DIAGNOSIS — J301 Allergic rhinitis due to pollen: Secondary | ICD-10-CM | POA: Diagnosis not present

## 2017-01-28 DIAGNOSIS — J301 Allergic rhinitis due to pollen: Secondary | ICD-10-CM | POA: Diagnosis not present

## 2017-01-28 DIAGNOSIS — J3089 Other allergic rhinitis: Secondary | ICD-10-CM | POA: Diagnosis not present

## 2017-02-04 DIAGNOSIS — J301 Allergic rhinitis due to pollen: Secondary | ICD-10-CM | POA: Diagnosis not present

## 2017-02-04 DIAGNOSIS — J3089 Other allergic rhinitis: Secondary | ICD-10-CM | POA: Diagnosis not present

## 2017-02-11 DIAGNOSIS — J3089 Other allergic rhinitis: Secondary | ICD-10-CM | POA: Diagnosis not present

## 2017-02-11 DIAGNOSIS — J301 Allergic rhinitis due to pollen: Secondary | ICD-10-CM | POA: Diagnosis not present

## 2017-02-17 DIAGNOSIS — J3089 Other allergic rhinitis: Secondary | ICD-10-CM | POA: Diagnosis not present

## 2017-02-17 DIAGNOSIS — J301 Allergic rhinitis due to pollen: Secondary | ICD-10-CM | POA: Diagnosis not present

## 2017-02-19 DIAGNOSIS — L814 Other melanin hyperpigmentation: Secondary | ICD-10-CM | POA: Diagnosis not present

## 2017-02-19 DIAGNOSIS — D225 Melanocytic nevi of trunk: Secondary | ICD-10-CM | POA: Diagnosis not present

## 2017-02-19 DIAGNOSIS — L309 Dermatitis, unspecified: Secondary | ICD-10-CM | POA: Diagnosis not present

## 2017-02-19 DIAGNOSIS — D1801 Hemangioma of skin and subcutaneous tissue: Secondary | ICD-10-CM | POA: Diagnosis not present

## 2017-02-24 DIAGNOSIS — L570 Actinic keratosis: Secondary | ICD-10-CM | POA: Diagnosis not present

## 2017-02-26 DIAGNOSIS — J3081 Allergic rhinitis due to animal (cat) (dog) hair and dander: Secondary | ICD-10-CM | POA: Diagnosis not present

## 2017-02-26 DIAGNOSIS — J301 Allergic rhinitis due to pollen: Secondary | ICD-10-CM | POA: Diagnosis not present

## 2017-02-26 DIAGNOSIS — J3089 Other allergic rhinitis: Secondary | ICD-10-CM | POA: Diagnosis not present

## 2017-03-04 DIAGNOSIS — J301 Allergic rhinitis due to pollen: Secondary | ICD-10-CM | POA: Diagnosis not present

## 2017-03-04 DIAGNOSIS — J3089 Other allergic rhinitis: Secondary | ICD-10-CM | POA: Diagnosis not present

## 2017-03-11 DIAGNOSIS — J3089 Other allergic rhinitis: Secondary | ICD-10-CM | POA: Diagnosis not present

## 2017-03-11 DIAGNOSIS — J301 Allergic rhinitis due to pollen: Secondary | ICD-10-CM | POA: Diagnosis not present

## 2017-03-17 DIAGNOSIS — R350 Frequency of micturition: Secondary | ICD-10-CM | POA: Diagnosis not present

## 2017-03-17 DIAGNOSIS — Z1151 Encounter for screening for human papillomavirus (HPV): Secondary | ICD-10-CM | POA: Diagnosis not present

## 2017-03-17 DIAGNOSIS — Z01419 Encounter for gynecological examination (general) (routine) without abnormal findings: Secondary | ICD-10-CM | POA: Diagnosis not present

## 2017-03-17 DIAGNOSIS — Z6828 Body mass index (BMI) 28.0-28.9, adult: Secondary | ICD-10-CM | POA: Diagnosis not present

## 2017-03-18 DIAGNOSIS — J3089 Other allergic rhinitis: Secondary | ICD-10-CM | POA: Diagnosis not present

## 2017-03-18 DIAGNOSIS — J3081 Allergic rhinitis due to animal (cat) (dog) hair and dander: Secondary | ICD-10-CM | POA: Diagnosis not present

## 2017-03-18 DIAGNOSIS — J301 Allergic rhinitis due to pollen: Secondary | ICD-10-CM | POA: Diagnosis not present

## 2017-03-19 DIAGNOSIS — J301 Allergic rhinitis due to pollen: Secondary | ICD-10-CM | POA: Diagnosis not present

## 2017-03-22 DIAGNOSIS — J3089 Other allergic rhinitis: Secondary | ICD-10-CM | POA: Diagnosis not present

## 2017-03-23 DIAGNOSIS — H5203 Hypermetropia, bilateral: Secondary | ICD-10-CM | POA: Diagnosis not present

## 2017-03-25 DIAGNOSIS — J301 Allergic rhinitis due to pollen: Secondary | ICD-10-CM | POA: Diagnosis not present

## 2017-03-25 DIAGNOSIS — J3089 Other allergic rhinitis: Secondary | ICD-10-CM | POA: Diagnosis not present

## 2017-03-25 DIAGNOSIS — J3081 Allergic rhinitis due to animal (cat) (dog) hair and dander: Secondary | ICD-10-CM | POA: Diagnosis not present

## 2017-03-31 DIAGNOSIS — L570 Actinic keratosis: Secondary | ICD-10-CM | POA: Diagnosis not present

## 2017-04-02 DIAGNOSIS — J3089 Other allergic rhinitis: Secondary | ICD-10-CM | POA: Diagnosis not present

## 2017-04-02 DIAGNOSIS — J301 Allergic rhinitis due to pollen: Secondary | ICD-10-CM | POA: Diagnosis not present

## 2017-04-06 DIAGNOSIS — J3089 Other allergic rhinitis: Secondary | ICD-10-CM | POA: Diagnosis not present

## 2017-04-06 DIAGNOSIS — J301 Allergic rhinitis due to pollen: Secondary | ICD-10-CM | POA: Diagnosis not present

## 2017-04-09 DIAGNOSIS — J301 Allergic rhinitis due to pollen: Secondary | ICD-10-CM | POA: Diagnosis not present

## 2017-04-09 DIAGNOSIS — J3089 Other allergic rhinitis: Secondary | ICD-10-CM | POA: Diagnosis not present

## 2017-04-13 DIAGNOSIS — J3089 Other allergic rhinitis: Secondary | ICD-10-CM | POA: Diagnosis not present

## 2017-04-13 DIAGNOSIS — J301 Allergic rhinitis due to pollen: Secondary | ICD-10-CM | POA: Diagnosis not present

## 2017-04-19 DIAGNOSIS — N952 Postmenopausal atrophic vaginitis: Secondary | ICD-10-CM | POA: Diagnosis not present

## 2017-04-19 DIAGNOSIS — R87610 Atypical squamous cells of undetermined significance on cytologic smear of cervix (ASC-US): Secondary | ICD-10-CM | POA: Diagnosis not present

## 2017-04-19 DIAGNOSIS — N72 Inflammatory disease of cervix uteri: Secondary | ICD-10-CM | POA: Diagnosis not present

## 2017-04-22 DIAGNOSIS — J301 Allergic rhinitis due to pollen: Secondary | ICD-10-CM | POA: Diagnosis not present

## 2017-04-22 DIAGNOSIS — J3089 Other allergic rhinitis: Secondary | ICD-10-CM | POA: Diagnosis not present

## 2017-04-27 DIAGNOSIS — J301 Allergic rhinitis due to pollen: Secondary | ICD-10-CM | POA: Diagnosis not present

## 2017-04-27 DIAGNOSIS — J3089 Other allergic rhinitis: Secondary | ICD-10-CM | POA: Diagnosis not present

## 2017-04-28 DIAGNOSIS — L700 Acne vulgaris: Secondary | ICD-10-CM | POA: Diagnosis not present

## 2017-04-28 DIAGNOSIS — L578 Other skin changes due to chronic exposure to nonionizing radiation: Secondary | ICD-10-CM | POA: Diagnosis not present

## 2017-05-07 DIAGNOSIS — J3089 Other allergic rhinitis: Secondary | ICD-10-CM | POA: Diagnosis not present

## 2017-05-07 DIAGNOSIS — J301 Allergic rhinitis due to pollen: Secondary | ICD-10-CM | POA: Diagnosis not present

## 2017-05-12 DIAGNOSIS — J3089 Other allergic rhinitis: Secondary | ICD-10-CM | POA: Diagnosis not present

## 2017-05-12 DIAGNOSIS — J301 Allergic rhinitis due to pollen: Secondary | ICD-10-CM | POA: Diagnosis not present

## 2017-05-12 DIAGNOSIS — Z0000 Encounter for general adult medical examination without abnormal findings: Secondary | ICD-10-CM | POA: Diagnosis not present

## 2017-05-12 DIAGNOSIS — M2550 Pain in unspecified joint: Secondary | ICD-10-CM | POA: Diagnosis not present

## 2017-05-12 DIAGNOSIS — Z800 Family history of malignant neoplasm of digestive organs: Secondary | ICD-10-CM | POA: Diagnosis not present

## 2017-05-12 DIAGNOSIS — Z803 Family history of malignant neoplasm of breast: Secondary | ICD-10-CM | POA: Diagnosis not present

## 2017-05-12 DIAGNOSIS — Z1389 Encounter for screening for other disorder: Secondary | ICD-10-CM | POA: Diagnosis not present

## 2017-05-20 DIAGNOSIS — J301 Allergic rhinitis due to pollen: Secondary | ICD-10-CM | POA: Diagnosis not present

## 2017-05-20 DIAGNOSIS — J3089 Other allergic rhinitis: Secondary | ICD-10-CM | POA: Diagnosis not present

## 2017-05-26 DIAGNOSIS — J301 Allergic rhinitis due to pollen: Secondary | ICD-10-CM | POA: Diagnosis not present

## 2017-05-26 DIAGNOSIS — J3089 Other allergic rhinitis: Secondary | ICD-10-CM | POA: Diagnosis not present

## 2017-06-02 DIAGNOSIS — J301 Allergic rhinitis due to pollen: Secondary | ICD-10-CM | POA: Diagnosis not present

## 2017-06-02 DIAGNOSIS — J3089 Other allergic rhinitis: Secondary | ICD-10-CM | POA: Diagnosis not present

## 2017-06-08 DIAGNOSIS — J3089 Other allergic rhinitis: Secondary | ICD-10-CM | POA: Diagnosis not present

## 2017-06-08 DIAGNOSIS — J301 Allergic rhinitis due to pollen: Secondary | ICD-10-CM | POA: Diagnosis not present

## 2017-06-18 DIAGNOSIS — J3089 Other allergic rhinitis: Secondary | ICD-10-CM | POA: Diagnosis not present

## 2017-06-18 DIAGNOSIS — J301 Allergic rhinitis due to pollen: Secondary | ICD-10-CM | POA: Diagnosis not present

## 2017-06-22 DIAGNOSIS — J301 Allergic rhinitis due to pollen: Secondary | ICD-10-CM | POA: Diagnosis not present

## 2017-06-22 DIAGNOSIS — J3089 Other allergic rhinitis: Secondary | ICD-10-CM | POA: Diagnosis not present

## 2017-06-29 DIAGNOSIS — J3089 Other allergic rhinitis: Secondary | ICD-10-CM | POA: Diagnosis not present

## 2017-06-29 DIAGNOSIS — J301 Allergic rhinitis due to pollen: Secondary | ICD-10-CM | POA: Diagnosis not present

## 2017-07-02 DIAGNOSIS — Z23 Encounter for immunization: Secondary | ICD-10-CM | POA: Diagnosis not present

## 2017-07-06 DIAGNOSIS — J3089 Other allergic rhinitis: Secondary | ICD-10-CM | POA: Diagnosis not present

## 2017-07-06 DIAGNOSIS — J301 Allergic rhinitis due to pollen: Secondary | ICD-10-CM | POA: Diagnosis not present

## 2017-07-14 DIAGNOSIS — J3089 Other allergic rhinitis: Secondary | ICD-10-CM | POA: Diagnosis not present

## 2017-07-14 DIAGNOSIS — J301 Allergic rhinitis due to pollen: Secondary | ICD-10-CM | POA: Diagnosis not present

## 2017-07-21 DIAGNOSIS — J3089 Other allergic rhinitis: Secondary | ICD-10-CM | POA: Diagnosis not present

## 2017-07-21 DIAGNOSIS — J301 Allergic rhinitis due to pollen: Secondary | ICD-10-CM | POA: Diagnosis not present

## 2017-07-27 DIAGNOSIS — J301 Allergic rhinitis due to pollen: Secondary | ICD-10-CM | POA: Diagnosis not present

## 2017-07-27 DIAGNOSIS — J3089 Other allergic rhinitis: Secondary | ICD-10-CM | POA: Diagnosis not present

## 2017-08-03 DIAGNOSIS — J3089 Other allergic rhinitis: Secondary | ICD-10-CM | POA: Diagnosis not present

## 2017-08-03 DIAGNOSIS — J301 Allergic rhinitis due to pollen: Secondary | ICD-10-CM | POA: Diagnosis not present

## 2017-08-11 DIAGNOSIS — J3089 Other allergic rhinitis: Secondary | ICD-10-CM | POA: Diagnosis not present

## 2017-08-11 DIAGNOSIS — J301 Allergic rhinitis due to pollen: Secondary | ICD-10-CM | POA: Diagnosis not present

## 2017-08-17 DIAGNOSIS — J301 Allergic rhinitis due to pollen: Secondary | ICD-10-CM | POA: Diagnosis not present

## 2017-08-17 DIAGNOSIS — J3089 Other allergic rhinitis: Secondary | ICD-10-CM | POA: Diagnosis not present

## 2017-08-18 DIAGNOSIS — J301 Allergic rhinitis due to pollen: Secondary | ICD-10-CM | POA: Diagnosis not present

## 2017-08-19 DIAGNOSIS — J3089 Other allergic rhinitis: Secondary | ICD-10-CM | POA: Diagnosis not present

## 2017-08-24 DIAGNOSIS — J301 Allergic rhinitis due to pollen: Secondary | ICD-10-CM | POA: Diagnosis not present

## 2017-08-24 DIAGNOSIS — J3089 Other allergic rhinitis: Secondary | ICD-10-CM | POA: Diagnosis not present

## 2017-08-26 DIAGNOSIS — J301 Allergic rhinitis due to pollen: Secondary | ICD-10-CM | POA: Diagnosis not present

## 2017-08-26 DIAGNOSIS — J3089 Other allergic rhinitis: Secondary | ICD-10-CM | POA: Diagnosis not present

## 2017-08-30 DIAGNOSIS — L578 Other skin changes due to chronic exposure to nonionizing radiation: Secondary | ICD-10-CM | POA: Diagnosis not present

## 2017-08-30 DIAGNOSIS — L700 Acne vulgaris: Secondary | ICD-10-CM | POA: Diagnosis not present

## 2017-08-30 DIAGNOSIS — L570 Actinic keratosis: Secondary | ICD-10-CM | POA: Diagnosis not present

## 2017-09-01 DIAGNOSIS — J3089 Other allergic rhinitis: Secondary | ICD-10-CM | POA: Diagnosis not present

## 2017-09-01 DIAGNOSIS — J301 Allergic rhinitis due to pollen: Secondary | ICD-10-CM | POA: Diagnosis not present

## 2017-09-03 DIAGNOSIS — J301 Allergic rhinitis due to pollen: Secondary | ICD-10-CM | POA: Diagnosis not present

## 2017-09-03 DIAGNOSIS — J3089 Other allergic rhinitis: Secondary | ICD-10-CM | POA: Diagnosis not present

## 2017-09-07 DIAGNOSIS — J301 Allergic rhinitis due to pollen: Secondary | ICD-10-CM | POA: Diagnosis not present

## 2017-09-07 DIAGNOSIS — J3089 Other allergic rhinitis: Secondary | ICD-10-CM | POA: Diagnosis not present

## 2017-09-15 DIAGNOSIS — J3089 Other allergic rhinitis: Secondary | ICD-10-CM | POA: Diagnosis not present

## 2017-09-15 DIAGNOSIS — J301 Allergic rhinitis due to pollen: Secondary | ICD-10-CM | POA: Diagnosis not present

## 2017-09-21 DIAGNOSIS — J3089 Other allergic rhinitis: Secondary | ICD-10-CM | POA: Diagnosis not present

## 2017-09-21 DIAGNOSIS — J301 Allergic rhinitis due to pollen: Secondary | ICD-10-CM | POA: Diagnosis not present

## 2017-09-28 DIAGNOSIS — J3089 Other allergic rhinitis: Secondary | ICD-10-CM | POA: Diagnosis not present

## 2017-09-28 DIAGNOSIS — J301 Allergic rhinitis due to pollen: Secondary | ICD-10-CM | POA: Diagnosis not present

## 2017-10-07 DIAGNOSIS — J3089 Other allergic rhinitis: Secondary | ICD-10-CM | POA: Diagnosis not present

## 2017-10-07 DIAGNOSIS — J301 Allergic rhinitis due to pollen: Secondary | ICD-10-CM | POA: Diagnosis not present

## 2017-10-14 DIAGNOSIS — J3089 Other allergic rhinitis: Secondary | ICD-10-CM | POA: Diagnosis not present

## 2017-10-14 DIAGNOSIS — J301 Allergic rhinitis due to pollen: Secondary | ICD-10-CM | POA: Diagnosis not present

## 2017-10-14 DIAGNOSIS — J3081 Allergic rhinitis due to animal (cat) (dog) hair and dander: Secondary | ICD-10-CM | POA: Diagnosis not present

## 2017-10-14 DIAGNOSIS — H1045 Other chronic allergic conjunctivitis: Secondary | ICD-10-CM | POA: Diagnosis not present

## 2017-10-19 DIAGNOSIS — J3089 Other allergic rhinitis: Secondary | ICD-10-CM | POA: Diagnosis not present

## 2017-10-19 DIAGNOSIS — J301 Allergic rhinitis due to pollen: Secondary | ICD-10-CM | POA: Diagnosis not present

## 2017-10-26 DIAGNOSIS — J301 Allergic rhinitis due to pollen: Secondary | ICD-10-CM | POA: Diagnosis not present

## 2017-10-26 DIAGNOSIS — J3089 Other allergic rhinitis: Secondary | ICD-10-CM | POA: Diagnosis not present

## 2017-11-02 DIAGNOSIS — J3089 Other allergic rhinitis: Secondary | ICD-10-CM | POA: Diagnosis not present

## 2017-11-02 DIAGNOSIS — J301 Allergic rhinitis due to pollen: Secondary | ICD-10-CM | POA: Diagnosis not present

## 2017-11-18 DIAGNOSIS — J301 Allergic rhinitis due to pollen: Secondary | ICD-10-CM | POA: Diagnosis not present

## 2017-11-18 DIAGNOSIS — J3089 Other allergic rhinitis: Secondary | ICD-10-CM | POA: Diagnosis not present

## 2017-11-26 DIAGNOSIS — J301 Allergic rhinitis due to pollen: Secondary | ICD-10-CM | POA: Diagnosis not present

## 2017-11-26 DIAGNOSIS — J3089 Other allergic rhinitis: Secondary | ICD-10-CM | POA: Diagnosis not present

## 2017-12-02 DIAGNOSIS — J3089 Other allergic rhinitis: Secondary | ICD-10-CM | POA: Diagnosis not present

## 2017-12-02 DIAGNOSIS — J301 Allergic rhinitis due to pollen: Secondary | ICD-10-CM | POA: Diagnosis not present

## 2017-12-07 DIAGNOSIS — J3089 Other allergic rhinitis: Secondary | ICD-10-CM | POA: Diagnosis not present

## 2017-12-07 DIAGNOSIS — J301 Allergic rhinitis due to pollen: Secondary | ICD-10-CM | POA: Diagnosis not present

## 2017-12-15 DIAGNOSIS — J301 Allergic rhinitis due to pollen: Secondary | ICD-10-CM | POA: Diagnosis not present

## 2017-12-15 DIAGNOSIS — J3089 Other allergic rhinitis: Secondary | ICD-10-CM | POA: Diagnosis not present

## 2017-12-21 DIAGNOSIS — J3089 Other allergic rhinitis: Secondary | ICD-10-CM | POA: Diagnosis not present

## 2017-12-21 DIAGNOSIS — J301 Allergic rhinitis due to pollen: Secondary | ICD-10-CM | POA: Diagnosis not present

## 2017-12-24 DIAGNOSIS — J301 Allergic rhinitis due to pollen: Secondary | ICD-10-CM | POA: Diagnosis not present

## 2017-12-24 DIAGNOSIS — J3089 Other allergic rhinitis: Secondary | ICD-10-CM | POA: Diagnosis not present

## 2017-12-28 DIAGNOSIS — J3089 Other allergic rhinitis: Secondary | ICD-10-CM | POA: Diagnosis not present

## 2017-12-28 DIAGNOSIS — J301 Allergic rhinitis due to pollen: Secondary | ICD-10-CM | POA: Diagnosis not present

## 2018-01-04 DIAGNOSIS — J301 Allergic rhinitis due to pollen: Secondary | ICD-10-CM | POA: Diagnosis not present

## 2018-01-04 DIAGNOSIS — J3089 Other allergic rhinitis: Secondary | ICD-10-CM | POA: Diagnosis not present

## 2018-01-12 DIAGNOSIS — J3089 Other allergic rhinitis: Secondary | ICD-10-CM | POA: Diagnosis not present

## 2018-01-12 DIAGNOSIS — J301 Allergic rhinitis due to pollen: Secondary | ICD-10-CM | POA: Diagnosis not present

## 2018-01-17 DIAGNOSIS — Z1231 Encounter for screening mammogram for malignant neoplasm of breast: Secondary | ICD-10-CM | POA: Diagnosis not present

## 2018-01-17 DIAGNOSIS — Z803 Family history of malignant neoplasm of breast: Secondary | ICD-10-CM | POA: Diagnosis not present

## 2018-01-21 ENCOUNTER — Other Ambulatory Visit: Payer: Self-pay | Admitting: Allergy and Immunology

## 2018-01-21 ENCOUNTER — Ambulatory Visit
Admission: RE | Admit: 2018-01-21 | Discharge: 2018-01-21 | Disposition: A | Discharge status: Home / Self Care | Payer: BLUE CROSS/BLUE SHIELD | Source: Ambulatory Visit | Attending: Allergy and Immunology | Admitting: Allergy and Immunology

## 2018-01-21 DIAGNOSIS — R059 Cough, unspecified: Secondary | ICD-10-CM

## 2018-01-21 DIAGNOSIS — J3081 Allergic rhinitis due to animal (cat) (dog) hair and dander: Secondary | ICD-10-CM | POA: Diagnosis not present

## 2018-01-21 DIAGNOSIS — R05 Cough: Secondary | ICD-10-CM | POA: Diagnosis not present

## 2018-01-21 DIAGNOSIS — J3089 Other allergic rhinitis: Secondary | ICD-10-CM | POA: Diagnosis not present

## 2018-01-21 DIAGNOSIS — J301 Allergic rhinitis due to pollen: Secondary | ICD-10-CM | POA: Diagnosis not present

## 2018-01-26 DIAGNOSIS — J3089 Other allergic rhinitis: Secondary | ICD-10-CM | POA: Diagnosis not present

## 2018-01-26 DIAGNOSIS — J301 Allergic rhinitis due to pollen: Secondary | ICD-10-CM | POA: Diagnosis not present

## 2018-01-28 DIAGNOSIS — J301 Allergic rhinitis due to pollen: Secondary | ICD-10-CM | POA: Diagnosis not present

## 2018-01-28 DIAGNOSIS — J3089 Other allergic rhinitis: Secondary | ICD-10-CM | POA: Diagnosis not present

## 2018-02-04 DIAGNOSIS — J3089 Other allergic rhinitis: Secondary | ICD-10-CM | POA: Diagnosis not present

## 2018-02-04 DIAGNOSIS — J301 Allergic rhinitis due to pollen: Secondary | ICD-10-CM | POA: Diagnosis not present

## 2018-02-07 DIAGNOSIS — J301 Allergic rhinitis due to pollen: Secondary | ICD-10-CM | POA: Diagnosis not present

## 2018-02-07 DIAGNOSIS — J3089 Other allergic rhinitis: Secondary | ICD-10-CM | POA: Diagnosis not present

## 2018-02-10 DIAGNOSIS — J3089 Other allergic rhinitis: Secondary | ICD-10-CM | POA: Diagnosis not present

## 2018-02-10 DIAGNOSIS — J301 Allergic rhinitis due to pollen: Secondary | ICD-10-CM | POA: Diagnosis not present

## 2018-02-21 DIAGNOSIS — L814 Other melanin hyperpigmentation: Secondary | ICD-10-CM | POA: Diagnosis not present

## 2018-02-21 DIAGNOSIS — J301 Allergic rhinitis due to pollen: Secondary | ICD-10-CM | POA: Diagnosis not present

## 2018-02-21 DIAGNOSIS — L570 Actinic keratosis: Secondary | ICD-10-CM | POA: Diagnosis not present

## 2018-02-21 DIAGNOSIS — L821 Other seborrheic keratosis: Secondary | ICD-10-CM | POA: Diagnosis not present

## 2018-02-21 DIAGNOSIS — D229 Melanocytic nevi, unspecified: Secondary | ICD-10-CM | POA: Diagnosis not present

## 2018-02-21 DIAGNOSIS — L819 Disorder of pigmentation, unspecified: Secondary | ICD-10-CM | POA: Diagnosis not present

## 2018-02-21 DIAGNOSIS — J3089 Other allergic rhinitis: Secondary | ICD-10-CM | POA: Diagnosis not present

## 2018-02-28 DIAGNOSIS — J3089 Other allergic rhinitis: Secondary | ICD-10-CM | POA: Diagnosis not present

## 2018-02-28 DIAGNOSIS — J301 Allergic rhinitis due to pollen: Secondary | ICD-10-CM | POA: Diagnosis not present

## 2018-03-08 DIAGNOSIS — J3089 Other allergic rhinitis: Secondary | ICD-10-CM | POA: Diagnosis not present

## 2018-03-08 DIAGNOSIS — J301 Allergic rhinitis due to pollen: Secondary | ICD-10-CM | POA: Diagnosis not present

## 2018-03-16 DIAGNOSIS — J301 Allergic rhinitis due to pollen: Secondary | ICD-10-CM | POA: Diagnosis not present

## 2018-03-16 DIAGNOSIS — J3089 Other allergic rhinitis: Secondary | ICD-10-CM | POA: Diagnosis not present

## 2018-03-23 DIAGNOSIS — J301 Allergic rhinitis due to pollen: Secondary | ICD-10-CM | POA: Diagnosis not present

## 2018-03-23 DIAGNOSIS — J3089 Other allergic rhinitis: Secondary | ICD-10-CM | POA: Diagnosis not present

## 2018-03-24 DIAGNOSIS — J3089 Other allergic rhinitis: Secondary | ICD-10-CM | POA: Diagnosis not present

## 2018-03-29 DIAGNOSIS — H5203 Hypermetropia, bilateral: Secondary | ICD-10-CM | POA: Diagnosis not present

## 2018-03-30 DIAGNOSIS — J301 Allergic rhinitis due to pollen: Secondary | ICD-10-CM | POA: Diagnosis not present

## 2018-03-30 DIAGNOSIS — J3089 Other allergic rhinitis: Secondary | ICD-10-CM | POA: Diagnosis not present

## 2018-04-04 ENCOUNTER — Encounter: Payer: Self-pay | Admitting: Physician Assistant

## 2018-04-04 ENCOUNTER — Ambulatory Visit: Payer: BLUE CROSS/BLUE SHIELD | Admitting: Physician Assistant

## 2018-04-04 VITALS — BP 118/72 | HR 86 | Ht 64.0 in | Wt 158.8 lb

## 2018-04-04 DIAGNOSIS — R6889 Other general symptoms and signs: Secondary | ICD-10-CM | POA: Diagnosis not present

## 2018-04-04 DIAGNOSIS — K219 Gastro-esophageal reflux disease without esophagitis: Secondary | ICD-10-CM | POA: Diagnosis not present

## 2018-04-04 DIAGNOSIS — R0989 Other specified symptoms and signs involving the circulatory and respiratory systems: Secondary | ICD-10-CM

## 2018-04-04 MED ORDER — PANTOPRAZOLE SODIUM 40 MG PO TBEC: 40.0000 mg | DELAYED_RELEASE_TABLET | Freq: Every day | ORAL | 11 refills | Status: DC

## 2018-04-04 NOTE — Progress Notes (Signed)
Reviewed and agree with management plan. Consider ENT referral at follow up visit if symptoms have not resolved.   Pricilla Riffle. Fuller Plan, MD [facility]

## 2018-04-04 NOTE — Patient Instructions (Signed)
If you are age 57 or older, your body mass index should be between 23-30. Your Body mass index is 27.26 kg/m. If this is out of the aforementioned range listed, please consider follow up with your Primary Care Provider.  If you are age 36 or younger, your body mass index should be between 19-25. Your Body mass index is 27.26 kg/m. If this is out of the aformentioned range listed, please consider follow up with your Primary Care Provider.   We have sent the following medications to your pharmacy for you to pick up at your convenience: Protonix 40 mg - take before dinner  You have been given anti-reflux diet, anti-reflux regimen handout.  Follow up with Dr. Fuller Plan or with me in 2 months.  Call the office for an appointment.  Thank you for choosing me and [facility].  Amy Esterwood, PA-C

## 2018-04-04 NOTE — Progress Notes (Signed)
Subjective:    Patient ID: Lindsay Blair, female    DOB: [birth year], 58 y.o.   MRN: [id number]  HPI Trista is a pleasant 58 year old white female, known to Dr. Fuller Plan on previous Colonoscopies.  She comes in today with concerns about recurrent laryngitis and reflux. Patient has family history of colon cancer, her last colonoscopy was done in October 2017 with one 4 mm tubular adenoma removed from the ascending colon and was also noted to have a few diverticuli.  She is indicated for 5-year interval follow-up.  She has not had prior endoscopy. She says she is not been bothered by chronic GERD.  Her current symptoms started a couple of months ago when she started noticing a fullness in her throat.  She said around that time she was also fatigued and having a lot of coughing.  She suffers from severe allergies and initially thought this was allergy related.  She was seen by her allergist who suggested that this may be more reflux related.  She did take a 14-day course of over-the-counter omeprazole and symptoms improved in a couple of weeks later symptoms recurred again.  She started herself back on over-the-counter omeprazole but did not have as good of result. She says most of the time she has a low level hoarseness and now is having more of a persistent feeling of fullness or lump sensation in her throat.  She is frequently waking up in the morning coughing which she describes as a dry cough.  She has no complaints of dysphagia or odynophagia.  No heartburn or indigestion no abdominal discomfort. She says she does like very dark coffee and red wine.  She has tried to adjust her diet some.  Review of Systems Pertinent positive and negative review of systems were noted in the above HPI section.  All other review of systems was otherwise negative.  Outpatient Encounter Medications as of 04/04/2018  Medication Sig  . Bepotastine Besilate (BEPREVE) 1.5 % SOLN Both eyes daily  . FLUoxetine (PROZAC) 20 MG  capsule Take 20 mg by mouth daily.  . fluticasone (FLONASE) 50 MCG/ACT nasal spray Place 2 sprays into the nose daily.  Marland Kitchen ibuprofen (ADVIL,MOTRIN) 200 MG tablet Take 200 mg by mouth. TAKE 2 PILLS PRN  . loratadine (CLARITIN) 10 MG tablet Take 10 mg by mouth daily.  Marland Kitchen PRESCRIPTION MEDICATION Allergy shots-for pollen once a week   Allergy shots-for dust/mold once a week  . pantoprazole (PROTONIX) 40 MG tablet Take 1 tablet (40 mg total) by mouth daily. Take one before dinner  . [DISCONTINUED] cetirizine-pseudoephedrine (ZYRTEC-D) 5-120 MG per tablet Take 1 tablet by mouth daily.   Facility-Administered Encounter Medications as of 04/04/2018  Medication  . 0.9 %  sodium chloride infusion   No Known Allergies Patient Active Problem List   Diagnosis Date Noted  . HEMORRHOIDS, INTERNAL 04/21/2007  . ALLERGIC RHINITIS 04/21/2007   Social History   Socioeconomic History  . Marital status: Married    Spouse name: Not on file  . Number of children: Not on file  . Years of education: Not on file  . Highest education level: Not on file  Occupational History  . Not on file  Social Needs  . Financial resource strain: Not on file  . Food insecurity:    Worry: Not on file    Inability: Not on file  . Transportation needs:    Medical: Not on file    Non-medical: Not on file  Tobacco Use  .  Smoking status: Never Smoker  . Smokeless tobacco: Never Used  Substance and Sexual Activity  . Alcohol use: Yes    Alcohol/week: 5.0 standard drinks    Types: 5 Glasses of wine per week  . Drug use: No  . Sexual activity: Not on file  Lifestyle  . Physical activity:    Days per week: Not on file    Minutes per session: Not on file  . Stress: Not on file  Relationships  . Social connections:    Talks on phone: Not on file    Gets together: Not on file    Attends religious service: Not on file    Active member of club or organization: Not on file    Attends meetings of clubs or organizations:  Not on file    Relationship status: Not on file  . Intimate partner violence:    Fear of current or ex partner: Not on file    Emotionally abused: Not on file    Physically abused: Not on file    Forced sexual activity: Not on file  Other Topics Concern  . Not on file  Social History Narrative  . Not on file    Ms. Cusic family history includes Breast cancer in her mother; Colon cancer (age of onset: 73) in her sister; Colon cancer (age of onset: 37) in her father; Colon polyps in her father.      Objective:    Vitals:   04/04/18 1048  BP: 118/72  Pulse: 86  SpO2: 99%    Physical Exam; Well-developed older white female in no acute distress, pleasant, height 5 foot 4, weight 158, BMI 27.2.  HEENT ;nontraumatic normocephalic EOMI PERRLA sclera anicteric, oral mucosa moist, Cardiovascular; regular rate and rhythm with S1-S2 no murmur rub or gallop, Pulmonary ;clear bilaterally.  Abdomen; soft, nontender nondistended bowel sounds are active there is no palpable mass or hepatosplenomegaly.  Rectal ;exam not done, Extremities; no clubbing cyanosis or edema skin warm and dry, Neuropsych; alert and oriented, grossly nonfocal mood and affect appropriate       Assessment & Plan:   #28 58 year old white female with 17-month history of recurrent hoarseness and a sensation of fullness in throat frequently worse in the early morning.  No heartburn or indigestion. Patient may have laryngeal pharyngeal reflux.  This may be difficult to sort out as she also has chronic allergies with upper respiratory symptoms.  2 history of tubular adenomatous colon polyp-up-to-date with surveillance, due for follow-up colonoscopy 2022 #3 family history of colon cancer  Plan; We reviewed an antireflux regimen, including dietary modification, n.p.o. for 2 to 3 hours prior to bedtime and elevation of the head of the bed or use of a wedge pillow to have the back elevated 45 degrees while sleeping.  We will  start Protonix 40 mg p.o. AC dinner. Have asked her to call back in 2 weeks if she is not having any significant improvement in her symptoms, at which time we may want to consider EGD, and higher dose PPI.   Otherwise have asked her to follow-up with Dr. Fuller Plan or myself in about 2 months.   Genia Harold PA-C 04/04/2018   Cc: Servando Salina, MD

## 2018-04-05 DIAGNOSIS — J301 Allergic rhinitis due to pollen: Secondary | ICD-10-CM | POA: Diagnosis not present

## 2018-04-05 DIAGNOSIS — J3089 Other allergic rhinitis: Secondary | ICD-10-CM | POA: Diagnosis not present

## 2018-04-11 DIAGNOSIS — J3089 Other allergic rhinitis: Secondary | ICD-10-CM | POA: Diagnosis not present

## 2018-04-11 DIAGNOSIS — J301 Allergic rhinitis due to pollen: Secondary | ICD-10-CM | POA: Diagnosis not present

## 2018-04-13 DIAGNOSIS — J301 Allergic rhinitis due to pollen: Secondary | ICD-10-CM | POA: Diagnosis not present

## 2018-04-13 DIAGNOSIS — J3089 Other allergic rhinitis: Secondary | ICD-10-CM | POA: Diagnosis not present

## 2018-04-20 DIAGNOSIS — J3089 Other allergic rhinitis: Secondary | ICD-10-CM | POA: Diagnosis not present

## 2018-04-20 DIAGNOSIS — J301 Allergic rhinitis due to pollen: Secondary | ICD-10-CM | POA: Diagnosis not present

## 2018-04-22 DIAGNOSIS — J301 Allergic rhinitis due to pollen: Secondary | ICD-10-CM | POA: Diagnosis not present

## 2018-04-22 DIAGNOSIS — J3089 Other allergic rhinitis: Secondary | ICD-10-CM | POA: Diagnosis not present

## 2018-05-05 DIAGNOSIS — J3089 Other allergic rhinitis: Secondary | ICD-10-CM | POA: Diagnosis not present

## 2018-05-05 DIAGNOSIS — J301 Allergic rhinitis due to pollen: Secondary | ICD-10-CM | POA: Diagnosis not present

## 2018-05-10 DIAGNOSIS — Z0000 Encounter for general adult medical examination without abnormal findings: Secondary | ICD-10-CM | POA: Diagnosis not present

## 2018-05-17 DIAGNOSIS — Z803 Family history of malignant neoplasm of breast: Secondary | ICD-10-CM | POA: Diagnosis not present

## 2018-05-17 DIAGNOSIS — Z800 Family history of malignant neoplasm of digestive organs: Secondary | ICD-10-CM | POA: Diagnosis not present

## 2018-05-17 DIAGNOSIS — E7800 Pure hypercholesterolemia, unspecified: Secondary | ICD-10-CM | POA: Diagnosis not present

## 2018-05-17 DIAGNOSIS — Z0000 Encounter for general adult medical examination without abnormal findings: Secondary | ICD-10-CM | POA: Diagnosis not present

## 2018-05-17 DIAGNOSIS — Z1331 Encounter for screening for depression: Secondary | ICD-10-CM | POA: Diagnosis not present

## 2018-05-17 DIAGNOSIS — J302 Other seasonal allergic rhinitis: Secondary | ICD-10-CM | POA: Diagnosis not present

## 2018-06-02 DIAGNOSIS — J301 Allergic rhinitis due to pollen: Secondary | ICD-10-CM | POA: Diagnosis not present

## 2018-06-02 DIAGNOSIS — J3089 Other allergic rhinitis: Secondary | ICD-10-CM | POA: Diagnosis not present

## 2018-06-15 DIAGNOSIS — J301 Allergic rhinitis due to pollen: Secondary | ICD-10-CM | POA: Diagnosis not present

## 2018-06-15 DIAGNOSIS — J3089 Other allergic rhinitis: Secondary | ICD-10-CM | POA: Diagnosis not present

## 2018-06-22 DIAGNOSIS — Z113 Encounter for screening for infections with a predominantly sexual mode of transmission: Secondary | ICD-10-CM | POA: Diagnosis not present

## 2018-06-22 DIAGNOSIS — Z6828 Body mass index (BMI) 28.0-28.9, adult: Secondary | ICD-10-CM | POA: Diagnosis not present

## 2018-06-22 DIAGNOSIS — Z1151 Encounter for screening for human papillomavirus (HPV): Secondary | ICD-10-CM | POA: Diagnosis not present

## 2018-06-22 DIAGNOSIS — Z124 Encounter for screening for malignant neoplasm of cervix: Secondary | ICD-10-CM | POA: Diagnosis not present

## 2018-06-22 DIAGNOSIS — Z01419 Encounter for gynecological examination (general) (routine) without abnormal findings: Secondary | ICD-10-CM | POA: Diagnosis not present

## 2018-06-28 DIAGNOSIS — J3081 Allergic rhinitis due to animal (cat) (dog) hair and dander: Secondary | ICD-10-CM | POA: Diagnosis not present

## 2018-06-28 DIAGNOSIS — J301 Allergic rhinitis due to pollen: Secondary | ICD-10-CM | POA: Diagnosis not present

## 2018-06-28 DIAGNOSIS — J3089 Other allergic rhinitis: Secondary | ICD-10-CM | POA: Diagnosis not present

## 2018-07-13 DIAGNOSIS — J301 Allergic rhinitis due to pollen: Secondary | ICD-10-CM | POA: Diagnosis not present

## 2018-07-13 DIAGNOSIS — J3089 Other allergic rhinitis: Secondary | ICD-10-CM | POA: Diagnosis not present

## 2018-07-22 DIAGNOSIS — J301 Allergic rhinitis due to pollen: Secondary | ICD-10-CM | POA: Diagnosis not present

## 2018-07-22 DIAGNOSIS — J3089 Other allergic rhinitis: Secondary | ICD-10-CM | POA: Diagnosis not present

## 2018-07-27 DIAGNOSIS — J301 Allergic rhinitis due to pollen: Secondary | ICD-10-CM | POA: Diagnosis not present

## 2018-07-27 DIAGNOSIS — J3089 Other allergic rhinitis: Secondary | ICD-10-CM | POA: Diagnosis not present

## 2018-08-11 DIAGNOSIS — J301 Allergic rhinitis due to pollen: Secondary | ICD-10-CM | POA: Diagnosis not present

## 2018-08-11 DIAGNOSIS — J3089 Other allergic rhinitis: Secondary | ICD-10-CM | POA: Diagnosis not present

## 2018-08-25 DIAGNOSIS — J301 Allergic rhinitis due to pollen: Secondary | ICD-10-CM | POA: Diagnosis not present

## 2018-08-25 DIAGNOSIS — J3089 Other allergic rhinitis: Secondary | ICD-10-CM | POA: Diagnosis not present

## 2018-09-06 DIAGNOSIS — J301 Allergic rhinitis due to pollen: Secondary | ICD-10-CM | POA: Diagnosis not present

## 2018-09-06 DIAGNOSIS — J3089 Other allergic rhinitis: Secondary | ICD-10-CM | POA: Diagnosis not present

## 2018-09-22 DIAGNOSIS — J301 Allergic rhinitis due to pollen: Secondary | ICD-10-CM | POA: Diagnosis not present

## 2018-09-22 DIAGNOSIS — J3089 Other allergic rhinitis: Secondary | ICD-10-CM | POA: Diagnosis not present

## 2018-10-06 DIAGNOSIS — J3089 Other allergic rhinitis: Secondary | ICD-10-CM | POA: Diagnosis not present

## 2018-10-06 DIAGNOSIS — J301 Allergic rhinitis due to pollen: Secondary | ICD-10-CM | POA: Diagnosis not present

## 2018-10-21 DIAGNOSIS — J3081 Allergic rhinitis due to animal (cat) (dog) hair and dander: Secondary | ICD-10-CM | POA: Diagnosis not present

## 2018-10-21 DIAGNOSIS — J3089 Other allergic rhinitis: Secondary | ICD-10-CM | POA: Diagnosis not present

## 2018-10-21 DIAGNOSIS — H1045 Other chronic allergic conjunctivitis: Secondary | ICD-10-CM | POA: Diagnosis not present

## 2018-10-21 DIAGNOSIS — J301 Allergic rhinitis due to pollen: Secondary | ICD-10-CM | POA: Diagnosis not present

## 2018-11-16 DIAGNOSIS — J301 Allergic rhinitis due to pollen: Secondary | ICD-10-CM | POA: Diagnosis not present

## 2018-11-16 DIAGNOSIS — J3089 Other allergic rhinitis: Secondary | ICD-10-CM | POA: Diagnosis not present

## 2018-12-12 DIAGNOSIS — J3089 Other allergic rhinitis: Secondary | ICD-10-CM | POA: Diagnosis not present

## 2018-12-12 DIAGNOSIS — J301 Allergic rhinitis due to pollen: Secondary | ICD-10-CM | POA: Diagnosis not present

## 2019-01-12 DIAGNOSIS — J3089 Other allergic rhinitis: Secondary | ICD-10-CM | POA: Diagnosis not present

## 2019-01-12 DIAGNOSIS — J301 Allergic rhinitis due to pollen: Secondary | ICD-10-CM | POA: Diagnosis not present

## 2019-01-31 DIAGNOSIS — R921 Mammographic calcification found on diagnostic imaging of breast: Secondary | ICD-10-CM | POA: Diagnosis not present

## 2019-02-06 ENCOUNTER — Other Ambulatory Visit: Payer: Self-pay | Admitting: Radiology

## 2019-02-06 DIAGNOSIS — N6489 Other specified disorders of breast: Secondary | ICD-10-CM | POA: Diagnosis not present

## 2019-02-06 DIAGNOSIS — R921 Mammographic calcification found on diagnostic imaging of breast: Secondary | ICD-10-CM | POA: Diagnosis not present

## 2019-02-08 DIAGNOSIS — J301 Allergic rhinitis due to pollen: Secondary | ICD-10-CM | POA: Diagnosis not present

## 2019-02-08 DIAGNOSIS — J3089 Other allergic rhinitis: Secondary | ICD-10-CM | POA: Diagnosis not present

## 2019-02-10 HISTORY — PX: BREAST EXCISIONAL BIOPSY: SUR124

## 2019-02-22 ENCOUNTER — Ambulatory Visit: Payer: Self-pay | Admitting: General Surgery

## 2019-02-22 DIAGNOSIS — N6021 Fibroadenosis of right breast: Secondary | ICD-10-CM

## 2019-02-22 DIAGNOSIS — D229 Melanocytic nevi, unspecified: Secondary | ICD-10-CM | POA: Diagnosis not present

## 2019-02-22 DIAGNOSIS — L700 Acne vulgaris: Secondary | ICD-10-CM | POA: Diagnosis not present

## 2019-02-22 DIAGNOSIS — L814 Other melanin hyperpigmentation: Secondary | ICD-10-CM | POA: Diagnosis not present

## 2019-02-22 DIAGNOSIS — L853 Xerosis cutis: Secondary | ICD-10-CM | POA: Diagnosis not present

## 2019-03-07 DIAGNOSIS — J301 Allergic rhinitis due to pollen: Secondary | ICD-10-CM | POA: Diagnosis not present

## 2019-03-07 DIAGNOSIS — J3089 Other allergic rhinitis: Secondary | ICD-10-CM | POA: Diagnosis not present

## 2019-03-22 ENCOUNTER — Ambulatory Visit (INDEPENDENT_AMBULATORY_CARE_PROVIDER_SITE_OTHER): Payer: Self-pay | Admitting: Physician Assistant

## 2019-03-22 ENCOUNTER — Encounter: Payer: Self-pay | Admitting: Physician Assistant

## 2019-03-22 DIAGNOSIS — K219 Gastro-esophageal reflux disease without esophagitis: Secondary | ICD-10-CM

## 2019-03-22 DIAGNOSIS — Z86010 Personal history of colonic polyps: Secondary | ICD-10-CM

## 2019-03-22 DIAGNOSIS — Z800 Family history of malignant neoplasm of digestive organs: Secondary | ICD-10-CM

## 2019-03-22 NOTE — Progress Notes (Signed)
Subjective:    Patient ID: Lindsay Blair, female    DOB: Jun 23, 1960, 59 y.o.   MRN: [id number] This service was provided via telemedicine.  Telephone/ call. The patient was located at home. The provider was located in provider's GI office. The patient did consent to this telephone visit and is aware of possible charges with her insurance for this visit. To persons participating in this telemedicine service were the patient and I. Time spent on call;6 min  HPI   Lindsay Blair is a pleasant 59 year old female, established with Dr. Fuller Plan with history of GERD, adenomatous colon polyps and family history of colon cancer. Visit today for refills on medication.  She is currently on Protonix 40 mg p.o. AC dinner and says this is controlling her reflux symptoms well.  She has no complaints of dysphagia   No complaints of abdominal pain, changes in bowel habits melena or hematochezia. She is asking about timing of follow-up colonoscopy given family history of colon cancer in her father, and her sister. Patient last had colonoscopy in October 2017.  She had one 4 mm polyp removed from the ascending colon which was a tubular adenoma and noted to have a few left colon diverticuli.  She is scheduled for an upcoming lumpectomy with diagnosis of sclerosing adenosis.Lindsay Blair  Her mom had breast cancer .  Review of Systems Pertinent positive and negative review of systems were noted in the above HPI section.  All other review of systems was otherwise negative.  Outpatient Encounter Medications as of 03/22/2019  Medication Sig  . Bepotastine Besilate (BEPREVE) 1.5 % SOLN Both eyes daily  . FLUoxetine (PROZAC) 20 MG capsule Take 20 mg by mouth daily.  . fluticasone (FLONASE) 50 MCG/ACT nasal spray Place 2 sprays into the nose daily.  Lindsay Blair ibuprofen (ADVIL,MOTRIN) 200 MG tablet Take 200 mg by mouth. TAKE 2 PILLS PRN  . loratadine (CLARITIN) 10 MG tablet Take 10 mg by mouth daily.  . pantoprazole (PROTONIX) 40 MG tablet Take  1 tablet (40 mg total) by mouth daily. Take one before dinner  . PRESCRIPTION MEDICATION Allergy shots-for pollen once a week   Allergy shots-for dust/mold once a week   Facility-Administered Encounter Medications as of 03/22/2019  Medication  . 0.9 %  sodium chloride infusion   No Known Allergies Patient Active Problem List   Diagnosis Date Noted  . HEMORRHOIDS, INTERNAL 04/21/2007  . ALLERGIC RHINITIS 04/21/2007   Social History   Socioeconomic History  . Marital status: Married    Spouse name: Not on file  . Number of children: Not on file  . Years of education: Not on file  . Highest education level: Not on file  Occupational History  . Not on file  Tobacco Use  . Smoking status: Never Smoker  . Smokeless tobacco: Never Used  Substance and Sexual Activity  . Alcohol use: Yes    Alcohol/week: 5.0 standard drinks    Types: 5 Glasses of wine per week  . Drug use: No  . Sexual activity: Not on file  Other Topics Concern  . Not on file  Social History Narrative  . Not on file   Social Determinants of Health   Financial Resource Strain:   . Difficulty of Paying Living Expenses: Not on file  Food Insecurity:   . Worried About Charity fundraiser in the Last Year: Not on file  . Ran Out of Food in the Last Year: Not on file  Transportation Needs:   . Lack  of Transportation (Medical): Not on file  . Lack of Transportation (Non-Medical): Not on file  Physical Activity:   . Days of Exercise per Week: Not on file  . Minutes of Exercise per Session: Not on file  Stress:   . Feeling of Stress : Not on file  Social Connections:   . Frequency of Communication with Friends and Family: Not on file  . Frequency of Social Gatherings with Friends and Family: Not on file  . Attends Religious Services: Not on file  . Active Member of Clubs or Organizations: Not on file  . Attends Archivist Meetings: Not on file  . Marital Status: Not on file  Intimate Partner  Violence:   . Fear of Current or Ex-Partner: Not on file  . Emotionally Abused: Not on file  . Physically Abused: Not on file  . Sexually Abused: Not on file    Lindsay Blair family history includes Breast cancer in her mother; Colon cancer (age of onset: 16) in her sister; Colon cancer (age of onset: 54) in her father; Colon polyps in her father.      Objective:    There were no vitals filed for this visit.  Physical Exam no exam today virtual visit       Assessment & Plan:   #59 59 year old white female with chronic GERD-stable on daily Protonix 40 mg and asymptomatic #2 personal history of adenomatous colon polyps #3 family history of colon cancer in a parent and sibling-patient is up-to-date with colon cancer surveillance with last exam October 2017 with removal of a 4 mm adenomatous colon polyp.  She is indicated for 5-year interval follow-up in 2022.  Plan; continue antireflux regimen Continue Protonix 40 mg p.o. AC dinner, refills x1 year We will plan follow-up colonoscopy in 2022, unless she should develop any symptoms such as changes in bowel habits or rectal bleeding. She will follow up with Dr. Fuller Plan or myself on an as-needed basis.  Lindsay Blair Lindsay Harold PA-C 03/22/2019   Cc: Tisovec, Fransico Him, MD

## 2019-03-23 NOTE — Progress Notes (Signed)
Reviewed and agree with management plan.  Green Quincy T. Ahava Kissoon, MD FACG [facility]  

## 2019-04-06 DIAGNOSIS — J3089 Other allergic rhinitis: Secondary | ICD-10-CM | POA: Diagnosis not present

## 2019-04-06 DIAGNOSIS — J301 Allergic rhinitis due to pollen: Secondary | ICD-10-CM | POA: Diagnosis not present

## 2019-04-12 ENCOUNTER — Other Ambulatory Visit: Payer: Self-pay

## 2019-04-12 ENCOUNTER — Encounter (HOSPITAL_BASED_OUTPATIENT_CLINIC_OR_DEPARTMENT_OTHER): Payer: Self-pay | Admitting: General Surgery

## 2019-04-15 ENCOUNTER — Other Ambulatory Visit (HOSPITAL_COMMUNITY)
Admission: RE | Admit: 2019-04-15 | Discharge: 2019-04-15 | Disposition: A | Discharge status: Home / Self Care | Payer: BLUE CROSS/BLUE SHIELD | Source: Ambulatory Visit | Attending: General Surgery | Admitting: General Surgery

## 2019-04-15 DIAGNOSIS — Z20822 Contact with and (suspected) exposure to covid-19: Secondary | ICD-10-CM | POA: Diagnosis not present

## 2019-04-15 DIAGNOSIS — Z01812 Encounter for preprocedural laboratory examination: Secondary | ICD-10-CM | POA: Diagnosis not present

## 2019-04-15 LAB — SARS CORONAVIRUS 2 (TAT 6-24 HRS): SARS Coronavirus 2: NEGATIVE

## 2019-04-18 DIAGNOSIS — N6081 Other benign mammary dysplasias of right breast: Secondary | ICD-10-CM | POA: Diagnosis not present

## 2019-04-18 NOTE — Progress Notes (Signed)

## 2019-04-19 ENCOUNTER — Ambulatory Visit (HOSPITAL_BASED_OUTPATIENT_CLINIC_OR_DEPARTMENT_OTHER): Payer: BLUE CROSS/BLUE SHIELD | Admitting: Anesthesiology

## 2019-04-19 ENCOUNTER — Ambulatory Visit (HOSPITAL_BASED_OUTPATIENT_CLINIC_OR_DEPARTMENT_OTHER)
Admission: RE | Admit: 2019-04-19 | Discharge: 2019-04-19 | Disposition: A | Discharge status: Home / Self Care | Payer: BLUE CROSS/BLUE SHIELD | Attending: General Surgery | Admitting: General Surgery

## 2019-04-19 ENCOUNTER — Encounter (HOSPITAL_BASED_OUTPATIENT_CLINIC_OR_DEPARTMENT_OTHER)
Admission: RE | Disposition: A | Discharge status: Home / Self Care | Payer: Self-pay | Source: Home / Self Care | Attending: General Surgery

## 2019-04-19 ENCOUNTER — Other Ambulatory Visit: Payer: Self-pay

## 2019-04-19 ENCOUNTER — Encounter (HOSPITAL_BASED_OUTPATIENT_CLINIC_OR_DEPARTMENT_OTHER): Payer: Self-pay | Admitting: General Surgery

## 2019-04-19 DIAGNOSIS — Z803 Family history of malignant neoplasm of breast: Secondary | ICD-10-CM | POA: Diagnosis not present

## 2019-04-19 DIAGNOSIS — N6091 Unspecified benign mammary dysplasia of right breast: Secondary | ICD-10-CM | POA: Insufficient documentation

## 2019-04-19 DIAGNOSIS — N6489 Other specified disorders of breast: Secondary | ICD-10-CM | POA: Insufficient documentation

## 2019-04-19 DIAGNOSIS — N6081 Other benign mammary dysplasias of right breast: Secondary | ICD-10-CM | POA: Diagnosis not present

## 2019-04-19 DIAGNOSIS — K648 Other hemorrhoids: Secondary | ICD-10-CM | POA: Diagnosis not present

## 2019-04-19 DIAGNOSIS — D4861 Neoplasm of uncertain behavior of right breast: Secondary | ICD-10-CM | POA: Diagnosis not present

## 2019-04-19 DIAGNOSIS — K219 Gastro-esophageal reflux disease without esophagitis: Secondary | ICD-10-CM | POA: Diagnosis not present

## 2019-04-19 DIAGNOSIS — J309 Allergic rhinitis, unspecified: Secondary | ICD-10-CM | POA: Diagnosis not present

## 2019-04-19 HISTORY — DX: Nausea with vomiting, unspecified: R11.2

## 2019-04-19 HISTORY — PX: BREAST LUMPECTOMY WITH RADIOACTIVE SEED LOCALIZATION: SHX6424

## 2019-04-19 HISTORY — DX: Other complications of anesthesia, initial encounter: T88.59XA

## 2019-04-19 HISTORY — DX: Nausea with vomiting, unspecified: Z98.890

## 2019-04-19 SURGERY — BREAST LUMPECTOMY WITH RADIOACTIVE SEED LOCALIZATION
Anesthesia: General | Site: Breast | Laterality: Right

## 2019-04-19 MED ORDER — EPHEDRINE SULFATE 50 MG/ML IJ SOLN
INTRAMUSCULAR | Status: DC | PRN
  Administered 2019-04-19: 5 mg via INTRAVENOUS

## 2019-04-19 MED ORDER — DEXAMETHASONE SODIUM PHOSPHATE 4 MG/ML IJ SOLN
INTRAMUSCULAR | Status: DC | PRN
  Administered 2019-04-19: 10 mg via INTRAVENOUS

## 2019-04-19 MED ORDER — ACETAMINOPHEN 500 MG PO TABS
ORAL_TABLET | ORAL | Status: AC
  Filled 2019-04-19: qty 2

## 2019-04-19 MED ORDER — CELECOXIB 200 MG PO CAPS
ORAL_CAPSULE | ORAL | Status: AC
  Filled 2019-04-19: qty 1

## 2019-04-19 MED ORDER — GABAPENTIN 300 MG PO CAPS
300.0000 mg | ORAL_CAPSULE | ORAL | Status: AC
  Administered 2019-04-19: 300 mg via ORAL

## 2019-04-19 MED ORDER — ACETAMINOPHEN 500 MG PO TABS
1000.0000 mg | ORAL_TABLET | ORAL | Status: AC
  Administered 2019-04-19: 10:00:00 1000 mg via ORAL

## 2019-04-19 MED ORDER — ONDANSETRON HCL 4 MG/2ML IJ SOLN
INTRAMUSCULAR | Status: DC | PRN
  Administered 2019-04-19: 4 mg via INTRAVENOUS

## 2019-04-19 MED ORDER — CELECOXIB 200 MG PO CAPS
200.0000 mg | ORAL_CAPSULE | ORAL | Status: AC
  Administered 2019-04-19: 200 mg via ORAL

## 2019-04-19 MED ORDER — CEFAZOLIN SODIUM-DEXTROSE 2-4 GM/100ML-% IV SOLN
INTRAVENOUS | Status: AC
  Filled 2019-04-19: qty 100

## 2019-04-19 MED ORDER — CEFAZOLIN SODIUM-DEXTROSE 2-4 GM/100ML-% IV SOLN
2.0000 g | INTRAVENOUS | Status: AC
  Administered 2019-04-19: 2 g via INTRAVENOUS

## 2019-04-19 MED ORDER — LACTATED RINGERS IV SOLN: INTRAVENOUS | Status: DC

## 2019-04-19 MED ORDER — SCOPOLAMINE 1 MG/3DAYS TD PT72
MEDICATED_PATCH | TRANSDERMAL | Status: AC
  Filled 2019-04-19: qty 1

## 2019-04-19 MED ORDER — MIDAZOLAM HCL 5 MG/5ML IJ SOLN
INTRAMUSCULAR | Status: DC | PRN
  Administered 2019-04-19: 2 mg via INTRAVENOUS

## 2019-04-19 MED ORDER — FENTANYL CITRATE (PF) 100 MCG/2ML IJ SOLN
50.0000 ug | INTRAMUSCULAR | Status: DC | PRN
  Administered 2019-04-19: 25 ug via INTRAVENOUS

## 2019-04-19 MED ORDER — BUPIVACAINE HCL 0.25 % IJ SOLN
INTRAMUSCULAR | Status: DC | PRN
  Administered 2019-04-19: 10 mL

## 2019-04-19 MED ORDER — GABAPENTIN 300 MG PO CAPS
ORAL_CAPSULE | ORAL | Status: AC
  Filled 2019-04-19: qty 1

## 2019-04-19 MED ORDER — HYDROCODONE-ACETAMINOPHEN 5-325 MG PO TABS: 1.0000 | ORAL_TABLET | Freq: Four times a day (QID) | ORAL | 0 refills | Status: DC | PRN

## 2019-04-19 MED ORDER — MIDAZOLAM HCL 2 MG/2ML IJ SOLN: 1.0000 mg | INTRAMUSCULAR | Status: DC | PRN

## 2019-04-19 MED ORDER — LIDOCAINE HCL (CARDIAC) PF 100 MG/5ML IV SOSY
PREFILLED_SYRINGE | INTRAVENOUS | Status: DC | PRN
  Administered 2019-04-19: 50 mg via INTRAVENOUS

## 2019-04-19 MED ORDER — SCOPOLAMINE 1 MG/3DAYS TD PT72
1.0000 | MEDICATED_PATCH | TRANSDERMAL | Status: DC
  Administered 2019-04-19: 1.5 mg via TRANSDERMAL

## 2019-04-19 MED ORDER — CHLORHEXIDINE GLUCONATE CLOTH 2 % EX PADS: 6.0000 | MEDICATED_PAD | Freq: Once | CUTANEOUS | Status: DC

## 2019-04-19 MED ORDER — FENTANYL CITRATE (PF) 100 MCG/2ML IJ SOLN
INTRAMUSCULAR | Status: DC | PRN
  Administered 2019-04-19: 100 ug via INTRAVENOUS

## 2019-04-19 MED ORDER — FENTANYL CITRATE (PF) 100 MCG/2ML IJ SOLN
INTRAMUSCULAR | Status: AC
  Filled 2019-04-19: qty 2

## 2019-04-19 MED ORDER — PROPOFOL 10 MG/ML IV BOLUS
INTRAVENOUS | Status: DC | PRN
  Administered 2019-04-19: 150 mg via INTRAVENOUS
  Administered 2019-04-19: 50 mg via INTRAVENOUS

## 2019-04-19 SURGICAL SUPPLY — 31 items
BLADE SURG 15 STRL LF DISP TIS (BLADE) ×1 IMPLANT
BLADE SURG 15 STRL SS (BLADE) ×2
CANISTER SUC SOCK COL 7IN (MISCELLANEOUS) ×2 IMPLANT
CANISTER SUCT 1200ML W/VALVE (MISCELLANEOUS) ×2 IMPLANT
CHLORAPREP W/TINT 26 (MISCELLANEOUS) ×2 IMPLANT
COVER BACK TABLE 60X90IN (DRAPES) ×2 IMPLANT
COVER MAYO STAND STRL (DRAPES) ×2 IMPLANT
COVER PROBE W GEL 5X96 (DRAPES) ×2 IMPLANT
DERMABOND ADVANCED (GAUZE/BANDAGES/DRESSINGS) ×1
DERMABOND ADVANCED .7 DNX12 (GAUZE/BANDAGES/DRESSINGS) ×1 IMPLANT
DRAPE LAPAROSCOPIC ABDOMINAL (DRAPES) ×2 IMPLANT
DRAPE UTILITY XL STRL (DRAPES) ×2 IMPLANT
ELECT COATED BLADE 2.86 ST (ELECTRODE) ×2 IMPLANT
ELECT REM PT RETURN 9FT ADLT (ELECTROSURGICAL) ×2
ELECTRODE REM PT RTRN 9FT ADLT (ELECTROSURGICAL) ×1 IMPLANT
GLOVE BIO SURGEON STRL SZ 6.5 (GLOVE) ×1 IMPLANT
GLOVE BIO SURGEON STRL SZ7.5 (GLOVE) ×4 IMPLANT
GOWN STRL REUS W/ TWL LRG LVL3 (GOWN DISPOSABLE) ×2 IMPLANT
GOWN STRL REUS W/TWL LRG LVL3 (GOWN DISPOSABLE) ×4
KIT MARKER MARGIN INK (KITS) ×2 IMPLANT
NDL HYPO 25X1 1.5 SAFETY (NEEDLE) IMPLANT
NEEDLE HYPO 25X1 1.5 SAFETY (NEEDLE) ×2 IMPLANT
PACK BASIN DAY SURGERY FS (CUSTOM PROCEDURE TRAY) ×2 IMPLANT
PENCIL SMOKE EVACUATOR (MISCELLANEOUS) ×2 IMPLANT
SLEEVE SCD COMPRESS KNEE MED (MISCELLANEOUS) ×2 IMPLANT
SPONGE LAP 18X18 RF (DISPOSABLE) ×2 IMPLANT
SUT MON AB 4-0 PC3 18 (SUTURE) ×2 IMPLANT
SUT VICRYL 3-0 CR8 SH (SUTURE) ×2 IMPLANT
SYR CONTROL 10ML LL (SYRINGE) ×1 IMPLANT
TOWEL GREEN STERILE FF (TOWEL DISPOSABLE) ×2 IMPLANT
TRAY FAXITRON CT DISP (TRAY / TRAY PROCEDURE) ×2 IMPLANT

## 2019-04-19 NOTE — Anesthesia Postprocedure Evaluation (Signed)
Anesthesia Post Note  Patient: Lindsay Blair  Procedure(s) Performed: RIGHT BREAST LUMPECTOMY WITH RADIOACTIVE SEED LOCALIZATION (Right Breast)     Patient location during evaluation: PACU Anesthesia Type: General Level of consciousness: sedated and patient cooperative Pain management: pain level controlled Vital Signs Assessment: post-procedure vital signs reviewed and stable Respiratory status: spontaneous breathing Cardiovascular status: stable Anesthetic complications: no    Last Vitals:  Vitals:   04/19/19 1245 04/19/19 1355  BP: 119/63 137/68  Pulse: 82 68  Resp: 16 18  Temp:  (!) 36.4 C  SpO2: 100% 100%    Last Pain:  Vitals:   04/19/19 1355  TempSrc: Oral  PainSc: [place]

## 2019-04-19 NOTE — Op Note (Signed)
04/19/2019  12:13 PM  PATIENT:  Lindsay Blair  59 y.o. female  PRE-OPERATIVE DIAGNOSIS:  RIGHT BREAST COMPLEX SCLEROSING LESION  POST-OPERATIVE DIAGNOSIS:  RIGHT BREAST COMPLEX SCLEROSING LESION  PROCEDURE:  Procedure(s): RIGHT BREAST LUMPECTOMY WITH RADIOACTIVE SEED LOCALIZATION (Right)  SURGEON:  Surgeon(s) and Role:    Jovita Kussmaul, MD - Primary  PHYSICIAN ASSISTANT:   ASSISTANTS: none   ANESTHESIA:   local and general  EBL:  20 mL   BLOOD ADMINISTERED:none  DRAINS: none   LOCAL MEDICATIONS USED:  MARCAINE     SPECIMEN:  Source of Specimen:  right breast tissue  DISPOSITION OF SPECIMEN:  PATHOLOGY  COUNTS:  YES  TOURNIQUET:  * No tourniquets in log *  DICTATION: .Dragon Dictation   After informed consent was obtained the patient was brought to the operating room and placed in the supine position on the operating table.  After adequate induction of general anesthesia the patient's right breast was prepped with ChloraPrep, allowed to dry, and draped in usual sterile manner.  An appropriate timeout was performed.  Previously an I-125 seed was placed in the upper outer quadrant of the right breast to mark an area of a complex sclerosing lesion.  The neoprobe was set to I-125 in the area of radioactivity was readily identified far in the upper outer quadrant.  Because of the superficial nature of the seed to the skin I elected to make a small elliptical incision in the skin overlying the area of radioactivity with a 15 blade knife.  The area around this was infiltrated with quarter percent Marcaine.  The incision was carried through the skin and subcutaneous tissue sharply with the electrocautery.  A circular portion of breast tissue was then excised sharply around the radioactive seed while checking the area of radioactivity frequently.  Once the specimen was removed it was oriented with the appropriate paint colors.  A specimen radiograph was obtained that showed the clip  and seed to be near the center of the specimen.  The specimen was then sent to pathology for further evaluation.  Hemostasis was achieved using the Bovie electrocautery.  The wound was irrigated with saline and infiltrated with more quarter percent Marcaine.  The deep layer of the wound was then closed with interrupted 3-0 Vicryl stitches.  The skin was then closed with a running 4-0 Monocryl subcuticular stitch.  Dermabond dressings were applied.  The patient tolerated the procedure well.  At the end of the case all needle sponge and instrument counts were correct.  The patient was then awakened and taken to recovery in stable condition.  PLAN OF CARE: Discharge to home after PACU  PATIENT DISPOSITION:  PACU - hemodynamically stable.   Delay start of Pharmacological VTE agent (>24hrs) due to surgical blood loss or risk of bleeding: not applicable

## 2019-04-19 NOTE — Discharge Instructions (Signed)
No Tylenol or ibuprofen until 4pm if needed   Post Anesthesia Home Care Instructions  Activity: Get plenty of rest for the remainder of the day. A responsible individual must stay with you for 24 hours following the procedure.  For the next 24 hours, DO NOT: -Drive a car -Paediatric nurse -Drink alcoholic beverages -Take any medication unless instructed by your physician -Make any legal decisions or sign important papers.  Meals: Start with liquid foods such as gelatin or soup. Progress to regular foods as tolerated. Avoid greasy, spicy, heavy foods. If nausea and/or vomiting occur, drink only clear liquids until the nausea and/or vomiting subsides. Call your physician if vomiting continues.  Special Instructions/Symptoms: Your throat may feel dry or sore from the anesthesia or the breathing tube placed in your throat during surgery. If this causes discomfort, gargle with warm salt water. The discomfort should disappear within 24 hours.  If you had a scopolamine patch placed behind your ear for the management of post- operative nausea and/or vomiting:  1. The medication in the patch is effective for 72 hours, after which it should be removed.  Wrap patch in a tissue and discard in the trash. Wash hands thoroughly with soap and water. 2. You may remove the patch earlier than 72 hours if you experience unpleasant side effects which may include dry mouth, dizziness or visual disturbances. 3. Avoid touching the patch. Wash your hands with soap and water after contact with the patch.

## 2019-04-19 NOTE — Anesthesia Preprocedure Evaluation (Signed)
Anesthesia Evaluation  Patient identified by MRN, date of birth, ID band Patient awake    Reviewed: Allergy & Precautions, NPO status , Patient's Chart, lab work & pertinent test results  History of Anesthesia Complications (+) PONV and history of anesthetic complications  Airway Mallampati: II  TM Distance: >3 FB Neck ROM: Full    Dental no notable dental hx. (+) Dental Advisory Given   Pulmonary neg pulmonary ROS,    Pulmonary exam normal breath sounds clear to auscultation       Cardiovascular negative cardio ROS Normal cardiovascular exam Rhythm:Regular Rate:Normal     Neuro/Psych negative neurological ROS  negative psych ROS   GI/Hepatic Neg liver ROS, GERD  ,  Endo/Other  negative endocrine ROS  Renal/GU negative Renal ROS     Musculoskeletal negative musculoskeletal ROS (+)   Abdominal   Peds  Hematology negative hematology ROS (+)   Anesthesia Other Findings   Reproductive/Obstetrics negative OB ROS                             Anesthesia Physical Anesthesia Plan  ASA: II  Anesthesia Plan: General   Post-op Pain Management:    Induction: Intravenous  PONV Risk Score and Plan: 4 or greater and Ondansetron, Dexamethasone, Midazolam, Scopolamine patch - Pre-op and Treatment may vary due to age or medical condition  Airway Management Planned: LMA  Additional Equipment:   Intra-op Plan:   Post-operative Plan: Extubation in OR  Informed Consent: I have reviewed the patients History and Physical, chart, labs and discussed the procedure including the risks, benefits and alternatives for the proposed anesthesia with the patient or authorized representative who has indicated his/her understanding and acceptance.     Dental advisory given  Plan Discussed with: CRNA  Anesthesia Plan Comments:         Anesthesia Quick Evaluation

## 2019-04-19 NOTE — Interval H&P Note (Signed)
History and Physical Interval Note:  04/19/2019 11:06 AM  Lindsay Blair  has presented today for surgery, with the diagnosis of RIGHT BREAST COMPLEX SCLEROSING LESION.  The various methods of treatment have been discussed with the patient and family. After consideration of risks, benefits and other options for treatment, the patient has consented to  Procedure(s): RIGHT BREAST LUMPECTOMY WITH RADIOACTIVE SEED LOCALIZATION (Right) as a surgical intervention.  The patient's history has been reviewed, patient examined, no change in status, stable for surgery.  I have reviewed the patient's chart and labs.  Questions were answered to the patient's satisfaction.     Autumn Messing III

## 2019-04-19 NOTE — H&P (Signed)
Lindsay Blair  Location: [facility] Surgery Patient #: [id number] DOB: 1960/11/19 Married / Language: English / Race: White Female   History of Present Illness The patient is a 60 year old female who presents with a complaint of Breast problems. We're asked to see the patient in consultation by Dr. Lear Ng to evaluate her for a complex sclerosing lesion of the right breast. The patient is a 59 year old white female who recently went for her routine screening mammogram. At that time she was found to have an 8 mm area of calcifications in the upper outer right breast that looked abnormal. This was biopsied and came back as a complex sclerosing lesion. She denies any breast pain. She is otherwise in good health and does not smoke. She does have a family history of breast cancer in her mother as well as colon cancer in a father and sister.   Past Surgical History  Colon Polyp Removal - Colonoscopy   Diagnostic Studies History  Mammogram  within last year Pap Smear  1-5 years ago  Allergies  No Known Drug Allergies   Medication History  FLUoxetine HCl (20MG  Capsule, Oral) Active. Bepreve (1.5% Solution, Ophthalmic) Active. Pantoprazole Sodium (40MG  Tablet DR, Oral) Active. Medications Reconciled  Social History Alcohol use  Moderate alcohol use. Caffeine use  Coffee. No drug use  Tobacco use  Never smoker.  Family History  Breast Cancer  Mother. Colon Cancer  Father, Sister.  Pregnancy / Birth History  Age at menarche  63 years. Age of menopause  51-55 Contraceptive History  Oral contraceptives. Gravida  1 Maternal age  37-35 Para  1    Review of Systems  General Not Present- Appetite Loss, Chills, Fatigue, Fever, Night Sweats, Weight Gain and Weight Loss. Skin Not Present- Change in Wart/Mole, Dryness, Hives, Jaundice, New Lesions, Non-Healing Wounds, Rash and Ulcer. HEENT Not Present- Earache, Hearing Loss, Hoarseness, Nose Bleed,  Oral Ulcers, Ringing in the Ears, Seasonal Allergies, Sinus Pain, Sore Throat, Visual Disturbances, Wears glasses/contact lenses and Yellow Eyes. Respiratory Not Present- Bloody sputum, Chronic Cough, Difficulty Breathing, Snoring and Wheezing. Breast Not Present- Breast Mass, Breast Pain, Nipple Discharge and Skin Changes. Cardiovascular Not Present- Chest Pain, Difficulty Breathing Lying Down, Leg Cramps, Palpitations, Rapid Heart Rate, Shortness of Breath and Swelling of Extremities. Gastrointestinal Not Present- Abdominal Pain, Bloating, Bloody Stool, Change in Bowel Habits, Chronic diarrhea, Constipation, Difficulty Swallowing, Excessive gas, Gets full quickly at meals, Hemorrhoids, Indigestion, Nausea, Rectal Pain and Vomiting. Female Genitourinary Not Present- Frequency, Nocturia, Painful Urination, Pelvic Pain and Urgency. Musculoskeletal Not Present- Back Pain, Joint Pain, Joint Stiffness, Muscle Pain, Muscle Weakness and Swelling of Extremities. Neurological Not Present- Decreased Memory, Fainting, Headaches, Numbness, Seizures, Tingling, Tremor, Trouble walking and Weakness. Psychiatric Not Present- Anxiety, Bipolar, Change in Sleep Pattern, Depression, Fearful and Frequent crying. Endocrine Not Present- Cold Intolerance, Excessive Hunger, Hair Changes, Heat Intolerance, Hot flashes and New Diabetes. Hematology Not Present- Blood Thinners, Easy Bruising, Excessive bleeding, Gland problems, HIV and Persistent Infections.  Vitals  Weight: 165 lb Height: 64in Body Surface Area: 1.8 m Body Mass Index: 28.32 kg/m  Temp.: 96.4F(Tympanic)  Pulse: 79 (Regular)  BP: 126/82 (Sitting, Left Arm, Standard)       Physical Exam  General Mental Status-Alert. General Appearance-Consistent with stated age. Hydration-Well hydrated. Voice-Normal.  Head and Neck Head-normocephalic, atraumatic with no lesions or palpable masses. Trachea-midline. Thyroid Gland  Characteristics - normal size and consistency.  Eye Eyeball - Bilateral-Extraocular movements intact. Sclera/Conjunctiva - Bilateral-No scleral icterus.  Chest and Lung Exam Chest and lung exam reveals -quiet, even and easy respiratory effort with no use of accessory muscles and on auscultation, normal breath sounds, no adventitious sounds and normal vocal resonance. Inspection Chest Wall - Normal. Back - normal.  Breast Note: There is no palpable mass in either breast. There is no palpable axillary, supraclavicular, or cervical lymphadenopathy.   Cardiovascular Cardiovascular examination reveals -normal heart sounds, regular rate and rhythm with no murmurs and normal pedal pulses bilaterally.  Abdomen Inspection Inspection of the abdomen reveals - No Hernias. Skin - Scar - no surgical scars. Palpation/Percussion Palpation and Percussion of the abdomen reveal - Soft, Non Tender, No Rebound tenderness, No Rigidity (guarding) and No hepatosplenomegaly. Auscultation Auscultation of the abdomen reveals - Bowel sounds normal.  Neurologic Neurologic evaluation reveals -alert and oriented x 3 with no impairment of recent or remote memory. Mental Status-Normal.  Musculoskeletal Normal Exam - Left-Upper Extremity Strength Normal and Lower Extremity Strength Normal. Normal Exam - Right-Upper Extremity Strength Normal and Lower Extremity Strength Normal.  Lymphatic Head & Neck  General Head & Neck Lymphatics: Bilateral - Description - Normal. Axillary  General Axillary Region: Bilateral - Description - Normal. Tenderness - Non Tender. Femoral & Inguinal  Generalized Femoral & Inguinal Lymphatics: Bilateral - Description - Normal. Tenderness - Non Tender.    Assessment & Plan  SCLEROSING ADENOSIS OF BREAST, RIGHT (N60.21) Impression: The patient appears to have an 8 mm area of complex sclerosing lesion in the upper outer quadrant of the right breast. Because of  the size and appearance of this area and because of her family history of breast and other cancers I do think she would benefit from having this removed. She would also like to have this done. I have discussed with her in detail the risks and benefits of the operation as well as some of the technical aspects including the use of a radioactive seed for localization and she understands and wishes to proceed. Given her family history think she may also benefit from a genetics evaluation and I will plan to refer her after we get her final pathology back. This patient encounter took 30 minutes today to perform the following: take history, perform exam, review outside records, interpret imaging, counsel the patient on their diagnosis and document encounter, findings & plan in the EHR Current Plans Pt Education - Overview of Breast Disorders: discussed with patient and provided information.

## 2019-04-19 NOTE — Anesthesia Procedure Notes (Signed)
Procedure Name: LMA Insertion Date/Time: 04/19/2019 11:39 AM Performed by: Lavonia Dana, CRNA Pre-anesthesia Checklist: Patient identified, Emergency Drugs available, Suction available and Patient being monitored Patient Re-evaluated:Patient Re-evaluated prior to induction Oxygen Delivery Method: Circle system utilized Preoxygenation: Pre-oxygenation with 100% oxygen Induction Type: IV induction Ventilation: Mask ventilation without difficulty LMA: LMA inserted LMA Size: 4.0 Number of attempts: 1 Airway Equipment and Method: Bite block Placement Confirmation: positive ETCO2 Tube secured with: Tape Dental Injury: Teeth and Oropharynx as per pre-operative assessment

## 2019-04-19 NOTE — Transfer of Care (Signed)
Immediate Anesthesia Transfer of Care Note  Patient: Lindsay Blair  Procedure(s) Performed: RIGHT BREAST LUMPECTOMY WITH RADIOACTIVE SEED LOCALIZATION (Right Breast)  Patient Location: PACU  Anesthesia Type:General  Level of Consciousness: awake, alert  and oriented  Airway & Oxygen Therapy: Patient Spontanous Breathing and Patient connected to face mask oxygen  Post-op Assessment: Report given to RN and Post -op Vital signs reviewed and stable  Post vital signs: Reviewed and stable  Last Vitals:  Vitals Value Taken Time  BP 135/72 04/19/19 1218  Temp    Pulse 92 04/19/19 1221  Resp 16 04/19/19 1221  SpO2 100 % 04/19/19 1221  Vitals shown include unvalidated device data.  Last Pain:  Vitals:   04/19/19 0946  TempSrc: Temporal  PainSc: 0-No pain      Patients Stated Pain Goal: 4 ([id number])  Complications: No apparent anesthesia complications

## 2019-04-20 ENCOUNTER — Encounter: Payer: Self-pay | Admitting: *Deleted

## 2019-04-21 LAB — SURGICAL PATHOLOGY

## 2019-04-25 ENCOUNTER — Other Ambulatory Visit: Payer: Self-pay | Admitting: Physician Assistant

## 2019-05-08 DIAGNOSIS — J301 Allergic rhinitis due to pollen: Secondary | ICD-10-CM | POA: Diagnosis not present

## 2019-05-08 DIAGNOSIS — J3089 Other allergic rhinitis: Secondary | ICD-10-CM | POA: Diagnosis not present

## 2019-05-11 ENCOUNTER — Telehealth: Payer: Self-pay | Admitting: Hematology and Oncology

## 2019-05-11 NOTE — Telephone Encounter (Signed)
Received a new pt referral from Dr. Brantley Stage for Lindsay Blair to be seen in the high risk clinic. Ms. Becklund has been cld and scheduled to see Dr. Lindi Adie on 4/13 at 815am. Pt aware to arrive 15 minutes early.

## 2019-05-19 DIAGNOSIS — Z0000 Encounter for general adult medical examination without abnormal findings: Secondary | ICD-10-CM | POA: Diagnosis not present

## 2019-05-19 DIAGNOSIS — E785 Hyperlipidemia, unspecified: Secondary | ICD-10-CM | POA: Diagnosis not present

## 2019-05-22 NOTE — Progress Notes (Signed)
[facility] NOTE  Patient Care Team: Tisovec, Fransico Him, MD as PCP - General (Internal Medicine)  CHIEF COMPLAINTS/PURPOSE OF CONSULTATION:  Newly diagnosed high risk for breast cancer  HISTORY OF PRESENTING ILLNESS:  Lindsay Blair 59 y.o. female is here because of recent diagnosis of high risk for breast cancer. Routine screening mammogram detected a 0.8cm area of calcifications in the right breast. Right breast biopsy on 02/06/19 showed a complex sclerosing lesion with calcifications. She underwent a lumpectomy on 04/19/19 with Dr. Marlou Starks for which pathology showed atypical lobular hyperplasia. She has a family history of breast cancer in her mother and colon cancer in her father and sister. She presents to the clinic today for initial evaluation and discussion of surveillance options.   I reviewed her records extensively and collaborated the history with the patient.  MEDICAL HISTORY:  Past Medical History:  Diagnosis Date  . Allergy   . Anxiety   . Complication of anesthesia   . Family history of colon cancer    sister- Dx age 69, father colon cancer died at age 58  . GERD (gastroesophageal reflux disease)   . Hx of adenomatous colonic polyps   . PONV (postoperative nausea and vomiting)     SURGICAL HISTORY: Past Surgical History:  Procedure Laterality Date  . BREAST LUMPECTOMY WITH RADIOACTIVE SEED LOCALIZATION Right 04/19/2019   Procedure: RIGHT BREAST LUMPECTOMY WITH RADIOACTIVE SEED LOCALIZATION;  Surgeon: Jovita Kussmaul, MD;  Location: [facility];  Service: General;  Laterality: Right;  . COLONOSCOPY    . NASAL SINUS SURGERY     1998    SOCIAL HISTORY: Social History   Socioeconomic History  . Marital status: Married    Spouse name: Not on file  . Number of children: Not on file  . Years of education: Not on file  . Highest education level: Not on file  Occupational History  . Not on file  Tobacco Use  . Smoking status:  Never Smoker  . Smokeless tobacco: Never Used  Substance and Sexual Activity  . Alcohol use: Yes    Alcohol/week: 5.0 standard drinks    Types: 5 Glasses of wine per week  . Drug use: No  . Sexual activity: Not on file  Other Topics Concern  . Not on file  Social History Narrative  . Not on file   Social Determinants of Health   Financial Resource Strain:   . Difficulty of Paying Living Expenses:   Food Insecurity:   . Worried About Charity fundraiser in the Last Year:   . Arboriculturist in the Last Year:   Transportation Needs:   . Film/video editor (Medical):   Marland Kitchen Lack of Transportation (Non-Medical):   Physical Activity:   . Days of Exercise per Week:   . Minutes of Exercise per Session:   Stress:   . Feeling of Stress :   Social Connections:   . Frequency of Communication with Friends and Family:   . Frequency of Social Gatherings with Friends and Family:   . Attends Religious Services:   . Active Member of Clubs or Organizations:   . Attends Archivist Meetings:   Marland Kitchen Marital Status:   Intimate Partner Violence:   . Fear of Current or Ex-Partner:   . Emotionally Abused:   Marland Kitchen Physically Abused:   . Sexually Abused:     FAMILY HISTORY: Family History  Problem Relation Age of Onset  . Breast cancer  Mother   . Colon polyps Father   . Colon cancer Father 60  . Colon cancer Sister 47    ALLERGIES:  has No Known Allergies.  MEDICATIONS:  Current Outpatient Medications  Medication Sig Dispense Refill  . Bepotastine Besilate (BEPREVE) 1.5 % SOLN Both eyes daily    . fluticasone (FLONASE) 50 MCG/ACT nasal spray Place 2 sprays into the nose daily.    Marland Kitchen loratadine (CLARITIN) 10 MG tablet Take 10 mg by mouth daily.    . pantoprazole (PROTONIX) 40 MG tablet TAKE 1 TABLET(40 MG) BY MOUTH DAILY BEFORE DINNER 30 tablet 11  . PRESCRIPTION MEDICATION Allergy shots-for pollen once a week   Allergy shots-for dust/mold once a week    . tamoxifen (NOLVADEX)  20 MG tablet Take 1 tablet (20 mg total) by mouth daily. 90 tablet 3   Current Facility-Administered Medications  Medication Dose Route Frequency Provider Last Rate Last Admin  . 0.9 %  sodium chloride infusion  500 mL Intravenous Continuous Ladene Artist, MD        REVIEW OF SYSTEMS:   Constitutional: Denies fevers, chills or abnormal night sweats Eyes: Denies blurriness of vision, double vision or watery eyes Ears, nose, mouth, throat, and face: Denies mucositis or sore throat Respiratory: Denies cough, dyspnea or wheezes Cardiovascular: Denies palpitation, chest discomfort or lower extremity swelling Gastrointestinal:  Denies nausea, heartburn or change in bowel habits Skin: Denies abnormal skin rashes Lymphatics: Denies new lymphadenopathy or easy bruising Neurological:Denies numbness, tingling or new weaknesses Behavioral/Psych: Mood is stable, no new changes  Breast: s/p right lumpectomy  All other systems were reviewed with the patient and are negative.  PHYSICAL EXAMINATION: ECOG PERFORMANCE STATUS: 1 - Symptomatic but completely ambulatory  Vitals:   05/23/19 0808  BP: (!) 123/58  Pulse: 65  Resp: 18  Temp: 97.8 F (36.6 C)  SpO2: 95%   Filed Weights   05/23/19 0808  Weight: 162 lb 3.2 oz (73.6 kg)    GENERAL:alert, no distress and comfortable SKIN: skin color, texture, turgor are normal, no rashes or significant lesions EYES: normal, conjunctiva are pink and non-injected, sclera clear OROPHARYNX:no exudate, no erythema and lips, buccal mucosa, and tongue normal  NECK: supple, thyroid normal size, non-tender, without nodularity LYMPH:  no palpable lymphadenopathy in the cervical, axillary or inguinal LUNGS: clear to auscultation and percussion with normal breathing effort HEART: regular rate & rhythm and no murmurs and no lower extremity edema ABDOMEN:abdomen soft, non-tender and normal bowel sounds Musculoskeletal:no cyanosis of digits and no clubbing    PSYCH: alert & oriented x 3 with fluent speech NEURO: no focal motor/sensory deficits   RADIOGRAPHIC STUDIES: I have personally reviewed the radiological reports and agreed with the findings in the report.  ASSESSMENT AND PLAN:  Atypical lobular hyperplasia (ALH) of right breast Routine screening mammogram detected a 0.8cm area of calcifications in the right breast. Right breast biopsy on 02/06/19 showed a complex sclerosing lesion with calcifications. She underwent a lumpectomy on 04/19/19 with Dr. Marlou Starks for which pathology showed atypical lobular hyperplasia  Atypical lobular hyperplasia: This is characterized by abnormal cells that are filling part of the lobule.  It appears to increase the risk of breast cancer by 3.75.3 fold.  There is risk of both ipsilateral and contralateral breast cancers.  The cumulative incidence of breast cancer is approximately 1 %/year.   Based upon [facility] breast cancer risk assessment tool: Risk of breast cancer is 29.6% and 10-year risk is 13.4%  Risk reduction  strategies:  I recommended appropriate lifestyle and dietary changes that include exercise, eating less red meat and increasing fruits and vegetables. Risk reduction with tamoxifen or raloxifene would reduce the risk by half.  Tamoxifen counseling: We discussed the risks and benefits of tamoxifen. These include but not limited to insomnia, hot flashes, mood changes, vaginal dryness, and weight gain. Although rare, serious side effects including endometrial cancer, risk of blood clots were also discussed. We strongly believe that the benefits far outweigh the risks. Patient understands these risks and consented to starting treatment. Planned treatment duration is 5 years. Patient plans to taper off and discontinue Prozac.  Breast cancer surveillance: Annual mammograms,  breast MRI because a lifetime risk of breast cancer risk of more than 20% along with breast exams. Patient due for mammogram in  December 2021. Her first breast MRI will be done in June 2022  She agreed to participate in antiestrogen therapy adherence and compliance study. Return to clinic in 3 months for follow-up as a MyChart virtual visit.  If she tolerates it well, we can see her once a year.   All questions were answered. The patient knows to call the clinic with any problems, questions or concerns.   Rulon Eisenmenger, MD, MPH 05/23/2019    I, Molly Dorshimer, am acting as scribe for Nicholas Lose, MD.  I have reviewed the above documentation for accuracy and completeness, and I agree with the above.

## 2019-05-23 ENCOUNTER — Inpatient Hospital Stay: Payer: BLUE CROSS/BLUE SHIELD | Attending: Hematology and Oncology | Admitting: Hematology and Oncology

## 2019-05-23 ENCOUNTER — Other Ambulatory Visit: Payer: Self-pay

## 2019-05-23 DIAGNOSIS — Z803 Family history of malignant neoplasm of breast: Secondary | ICD-10-CM

## 2019-05-23 DIAGNOSIS — N6091 Unspecified benign mammary dysplasia of right breast: Secondary | ICD-10-CM | POA: Diagnosis not present

## 2019-05-23 DIAGNOSIS — Z800 Family history of malignant neoplasm of digestive organs: Secondary | ICD-10-CM

## 2019-05-23 MED ORDER — TAMOXIFEN CITRATE 20 MG PO TABS: 20.0000 mg | ORAL_TABLET | Freq: Every day | ORAL | 3 refills | Status: DC

## 2019-05-23 NOTE — Assessment & Plan Note (Signed)
Routine screening mammogram detected a 0.8cm area of calcifications in the right breast. Right breast biopsy on 02/06/19 showed a complex sclerosing lesion with calcifications. She underwent a lumpectomy on 04/19/19 with Dr. Marlou Starks for which pathology showed atypical lobular hyperplasia  Atypical lobular hyperplasia: This is characterized by abnormal cells that are filling part of the lobule.  It appears to increase the risk of breast cancer by 3.75.3 fold.  There is risk of both ipsilateral and contralateral breast cancers.  The cumulative incidence of breast cancer is approximately 1 %/year.   Based upon [employer] breast cancer risk assessment tool: Risk of breast cancer is  Risk reduction strategies:  I recommended appropriate lifestyle and dietary changes that include exercise, eating less red meat and increasing fruits and vegetables. Risk reduction with tamoxifen or raloxifene would reduce the risk by half.  Tamoxifen counseling: We discussed the risks and benefits of tamoxifen. These include but not limited to insomnia, hot flashes, mood changes, vaginal dryness, and weight gain. Although rare, serious side effects including endometrial cancer, risk of blood clots were also discussed. We strongly believe that the benefits far outweigh the risks. Patient understands these risks and consented to starting treatment. Planned treatment duration is 5 years.  Breast cancer surveillance: Annual mammograms, consider breast MRI because a lifetime risk of breast cancer risk of more than 20% along with breast exams.

## 2019-05-25 ENCOUNTER — Telehealth: Payer: Self-pay | Admitting: Hematology and Oncology

## 2019-05-25 NOTE — Telephone Encounter (Signed)
Scheduled per los, patient has been called and notified. 

## 2019-05-26 DIAGNOSIS — N6091 Unspecified benign mammary dysplasia of right breast: Secondary | ICD-10-CM | POA: Diagnosis not present

## 2019-05-26 DIAGNOSIS — Z800 Family history of malignant neoplasm of digestive organs: Secondary | ICD-10-CM | POA: Diagnosis not present

## 2019-05-26 DIAGNOSIS — J302 Other seasonal allergic rhinitis: Secondary | ICD-10-CM | POA: Diagnosis not present

## 2019-05-26 DIAGNOSIS — Z803 Family history of malignant neoplasm of breast: Secondary | ICD-10-CM | POA: Diagnosis not present

## 2019-05-26 DIAGNOSIS — Z0000 Encounter for general adult medical examination without abnormal findings: Secondary | ICD-10-CM | POA: Diagnosis not present

## 2019-05-29 DIAGNOSIS — J301 Allergic rhinitis due to pollen: Secondary | ICD-10-CM | POA: Diagnosis not present

## 2019-05-30 DIAGNOSIS — J3089 Other allergic rhinitis: Secondary | ICD-10-CM | POA: Diagnosis not present

## 2019-05-30 DIAGNOSIS — J301 Allergic rhinitis due to pollen: Secondary | ICD-10-CM | POA: Diagnosis not present

## 2019-06-02 DIAGNOSIS — J3089 Other allergic rhinitis: Secondary | ICD-10-CM | POA: Diagnosis not present

## 2019-06-02 DIAGNOSIS — J301 Allergic rhinitis due to pollen: Secondary | ICD-10-CM | POA: Diagnosis not present

## 2019-06-05 DIAGNOSIS — J3089 Other allergic rhinitis: Secondary | ICD-10-CM | POA: Diagnosis not present

## 2019-06-05 DIAGNOSIS — J301 Allergic rhinitis due to pollen: Secondary | ICD-10-CM | POA: Diagnosis not present

## 2019-06-07 DIAGNOSIS — J301 Allergic rhinitis due to pollen: Secondary | ICD-10-CM | POA: Diagnosis not present

## 2019-06-07 DIAGNOSIS — J3089 Other allergic rhinitis: Secondary | ICD-10-CM | POA: Diagnosis not present

## 2019-06-12 DIAGNOSIS — J301 Allergic rhinitis due to pollen: Secondary | ICD-10-CM | POA: Diagnosis not present

## 2019-06-12 DIAGNOSIS — J3089 Other allergic rhinitis: Secondary | ICD-10-CM | POA: Diagnosis not present

## 2019-06-15 ENCOUNTER — Other Ambulatory Visit: Payer: Self-pay | Admitting: Adult Health

## 2019-06-15 ENCOUNTER — Encounter (INDEPENDENT_AMBULATORY_CARE_PROVIDER_SITE_OTHER): Payer: Self-pay

## 2019-06-22 ENCOUNTER — Encounter (INDEPENDENT_AMBULATORY_CARE_PROVIDER_SITE_OTHER): Payer: Self-pay

## 2019-07-04 DIAGNOSIS — N950 Postmenopausal bleeding: Secondary | ICD-10-CM | POA: Diagnosis not present

## 2019-07-06 ENCOUNTER — Encounter (INDEPENDENT_AMBULATORY_CARE_PROVIDER_SITE_OTHER): Payer: Self-pay

## 2019-07-07 ENCOUNTER — Telehealth: Payer: Self-pay | Admitting: *Deleted

## 2019-07-07 DIAGNOSIS — Z01419 Encounter for gynecological examination (general) (routine) without abnormal findings: Secondary | ICD-10-CM | POA: Diagnosis not present

## 2019-07-07 DIAGNOSIS — Z124 Encounter for screening for malignant neoplasm of cervix: Secondary | ICD-10-CM | POA: Diagnosis not present

## 2019-07-07 DIAGNOSIS — N950 Postmenopausal bleeding: Secondary | ICD-10-CM | POA: Diagnosis not present

## 2019-07-07 DIAGNOSIS — Z1151 Encounter for screening for human papillomavirus (HPV): Secondary | ICD-10-CM | POA: Diagnosis not present

## 2019-07-07 MED ORDER — VENLAFAXINE HCL ER 37.5 MG PO CP24: 37.5000 mg | ORAL_CAPSULE | Freq: Every day | ORAL | 3 refills | Status: DC

## 2019-07-07 NOTE — Telephone Encounter (Signed)
Spoke to pt regarding increase in hot flashes. Discussed Effexor and gave instructions as well as sent in to pt preferred pharmacy.

## 2019-07-07 NOTE — Telephone Encounter (Signed)
Left vm for pt to return call regarding symptoms from anti-estrogen oral therapy. Contact information provided.

## 2019-07-11 DIAGNOSIS — J301 Allergic rhinitis due to pollen: Secondary | ICD-10-CM | POA: Diagnosis not present

## 2019-07-11 DIAGNOSIS — J3089 Other allergic rhinitis: Secondary | ICD-10-CM | POA: Diagnosis not present

## 2019-07-13 ENCOUNTER — Encounter (INDEPENDENT_AMBULATORY_CARE_PROVIDER_SITE_OTHER): Payer: Self-pay

## 2019-07-18 DIAGNOSIS — H5203 Hypermetropia, bilateral: Secondary | ICD-10-CM | POA: Diagnosis not present

## 2019-07-27 ENCOUNTER — Encounter (INDEPENDENT_AMBULATORY_CARE_PROVIDER_SITE_OTHER): Payer: Self-pay

## 2019-08-03 ENCOUNTER — Encounter (INDEPENDENT_AMBULATORY_CARE_PROVIDER_SITE_OTHER): Payer: Self-pay

## 2019-08-08 DIAGNOSIS — J301 Allergic rhinitis due to pollen: Secondary | ICD-10-CM | POA: Diagnosis not present

## 2019-08-08 DIAGNOSIS — J3089 Other allergic rhinitis: Secondary | ICD-10-CM | POA: Diagnosis not present

## 2019-08-15 ENCOUNTER — Encounter (INDEPENDENT_AMBULATORY_CARE_PROVIDER_SITE_OTHER): Payer: Self-pay

## 2019-08-17 ENCOUNTER — Encounter (INDEPENDENT_AMBULATORY_CARE_PROVIDER_SITE_OTHER): Payer: Self-pay

## 2019-08-22 ENCOUNTER — Telehealth: Payer: BLUE CROSS/BLUE SHIELD | Admitting: Hematology and Oncology

## 2019-08-28 ENCOUNTER — Telehealth: Payer: Self-pay | Admitting: Hematology and Oncology

## 2019-08-28 NOTE — Progress Notes (Signed)
  HEMATOLOGY-ONCOLOGY MYCHART VIDEO VISIT PROGRESS NOTE  I connected with Lindsay Blair on 08/29/2019 at 11:30 AM EDT by MyChart video conference and verified that I am speaking with the correct person using two identifiers.  I discussed the limitations, risks, security and privacy concerns of performing an evaluation and management service by MyChart and the availability of in person appointments.  I also discussed with the patient that there may be a patient responsible charge related to this service. The patient expressed understanding and agreed to proceed.  Patient's Location: Home Physician Location: Clinic  CHIEF COMPLIANT: Follow-up of high risk for breast cancer on tamoxifen  INTERVAL HISTORY: Lindsay Blair is a 59 y.o. female with above-mentioned history of high risk for breast cancer and is currently on risk reduction with tamoxifen. She presents over MyChart today for follow-up.   Observations/Objective:    Assessment Plan:  Atypical lobular hyperplasia (ALH) of right breast Routine screening mammogram detected a 0.8cm area of calcifications in the right breast. Right breast biopsy on 02/06/19 showed a complex sclerosing lesion with calcifications. She underwent a lumpectomy on 04/19/19 with Dr. Marlou Starks for which pathology showed atypical lobular hyperplasia  Atypical lobular hyperplasia: This is characterized by abnormal cells that are filling part of the lobule.  It appears to increase the risk of breast cancer by 3.75.3 fold.  There is risk of both ipsilateral and contralateral breast cancers.  The cumulative incidence of breast cancer is approximately 1 %/year.  Based upon [employer] breast cancer risk assessment tool: Risk of breast cancer is 29.6% and 10-year risk is 13.4% Current treatment: Tamoxifen 20 mg daily  Tamoxifen toxicities: Severe Hot flashes   Breast cancer surveillance: 1.  Annual mammograms planned for December 2021 2. annual breast MRI planned for  June 2022  Return to clinic in 1 year for follow-up    I discussed the assessment and treatment plan with the patient. The patient was provided an opportunity to ask questions and all were answered. The patient agreed with the plan and demonstrated an understanding of the instructions. The patient was advised to call back or seek an in-person evaluation if the symptoms worsen or if the condition fails to improve as anticipated.   I provided 20 minutes of face-to-face MyChart video visit time during this encounter.    Rulon Eisenmenger, MD 08/29/2019   I, Molly Dorshimer, am acting as scribe for Nicholas Lose, MD.  I have reviewed the above documentation for accuracy and completeness, and I agree with the above.

## 2019-08-29 ENCOUNTER — Inpatient Hospital Stay: Payer: BLUE CROSS/BLUE SHIELD | Attending: Hematology and Oncology | Admitting: Hematology and Oncology

## 2019-08-29 DIAGNOSIS — N6091 Unspecified benign mammary dysplasia of right breast: Secondary | ICD-10-CM

## 2019-08-29 NOTE — Assessment & Plan Note (Signed)
Routine screening mammogram detected a 0.8cm area of calcifications in the right breast. Right breast biopsy on 02/06/19 showed a complex sclerosing lesion with calcifications. She underwent a lumpectomy on 04/19/19 with Dr. Marlou Starks for which pathology showed atypical lobular hyperplasia  Atypical lobular hyperplasia: This is characterized by abnormal cells that are filling part of the lobule.  It appears to increase the risk of breast cancer by 3.75.3 fold.  There is risk of both ipsilateral and contralateral breast cancers.  The cumulative incidence of breast cancer is approximately 1 %/year.  Based upon [employer] breast cancer risk assessment tool: Risk of breast cancer is 29.6% and 10-year risk is 13.4% Current treatment: Tamoxifen Tamoxifen toxicities:  Breast cancer surveillance: 1.  Annual mammograms planned for December 2021 2. annual breast MRI planned for June 2022  Return to clinic in 1 year for follow-up

## 2019-09-04 DIAGNOSIS — J301 Allergic rhinitis due to pollen: Secondary | ICD-10-CM | POA: Diagnosis not present

## 2019-09-04 DIAGNOSIS — J3089 Other allergic rhinitis: Secondary | ICD-10-CM | POA: Diagnosis not present

## 2019-09-07 ENCOUNTER — Encounter (INDEPENDENT_AMBULATORY_CARE_PROVIDER_SITE_OTHER): Payer: Self-pay

## 2019-09-14 ENCOUNTER — Encounter (INDEPENDENT_AMBULATORY_CARE_PROVIDER_SITE_OTHER): Payer: Self-pay

## 2019-09-21 ENCOUNTER — Encounter (INDEPENDENT_AMBULATORY_CARE_PROVIDER_SITE_OTHER): Payer: Self-pay

## 2019-10-02 DIAGNOSIS — J301 Allergic rhinitis due to pollen: Secondary | ICD-10-CM | POA: Diagnosis not present

## 2019-10-02 DIAGNOSIS — J3089 Other allergic rhinitis: Secondary | ICD-10-CM | POA: Diagnosis not present

## 2019-10-12 ENCOUNTER — Encounter (INDEPENDENT_AMBULATORY_CARE_PROVIDER_SITE_OTHER): Payer: Self-pay

## 2019-10-19 ENCOUNTER — Encounter (INDEPENDENT_AMBULATORY_CARE_PROVIDER_SITE_OTHER): Payer: Self-pay

## 2019-10-23 DIAGNOSIS — J3081 Allergic rhinitis due to animal (cat) (dog) hair and dander: Secondary | ICD-10-CM | POA: Diagnosis not present

## 2019-10-23 DIAGNOSIS — J301 Allergic rhinitis due to pollen: Secondary | ICD-10-CM | POA: Diagnosis not present

## 2019-10-23 DIAGNOSIS — J3089 Other allergic rhinitis: Secondary | ICD-10-CM | POA: Diagnosis not present

## 2019-10-23 DIAGNOSIS — H1045 Other chronic allergic conjunctivitis: Secondary | ICD-10-CM | POA: Diagnosis not present

## 2019-10-24 ENCOUNTER — Other Ambulatory Visit: Payer: Self-pay | Admitting: Adult Health

## 2019-11-09 ENCOUNTER — Encounter (INDEPENDENT_AMBULATORY_CARE_PROVIDER_SITE_OTHER): Payer: Self-pay

## 2019-11-16 ENCOUNTER — Encounter (INDEPENDENT_AMBULATORY_CARE_PROVIDER_SITE_OTHER): Payer: Self-pay

## 2019-11-23 DIAGNOSIS — J3089 Other allergic rhinitis: Secondary | ICD-10-CM | POA: Diagnosis not present

## 2019-11-23 DIAGNOSIS — J3081 Allergic rhinitis due to animal (cat) (dog) hair and dander: Secondary | ICD-10-CM | POA: Diagnosis not present

## 2019-11-23 DIAGNOSIS — J301 Allergic rhinitis due to pollen: Secondary | ICD-10-CM | POA: Diagnosis not present

## 2019-11-23 DIAGNOSIS — Z23 Encounter for immunization: Secondary | ICD-10-CM | POA: Diagnosis not present

## 2019-11-30 ENCOUNTER — Encounter (INDEPENDENT_AMBULATORY_CARE_PROVIDER_SITE_OTHER): Payer: Self-pay

## 2019-12-07 ENCOUNTER — Encounter (INDEPENDENT_AMBULATORY_CARE_PROVIDER_SITE_OTHER): Payer: Self-pay

## 2019-12-14 ENCOUNTER — Encounter (INDEPENDENT_AMBULATORY_CARE_PROVIDER_SITE_OTHER): Payer: Self-pay

## 2019-12-19 DIAGNOSIS — J301 Allergic rhinitis due to pollen: Secondary | ICD-10-CM | POA: Diagnosis not present

## 2019-12-19 DIAGNOSIS — J3089 Other allergic rhinitis: Secondary | ICD-10-CM | POA: Diagnosis not present

## 2019-12-28 ENCOUNTER — Encounter (INDEPENDENT_AMBULATORY_CARE_PROVIDER_SITE_OTHER): Payer: Self-pay

## 2020-01-03 DIAGNOSIS — N6091 Unspecified benign mammary dysplasia of right breast: Secondary | ICD-10-CM | POA: Diagnosis not present

## 2020-01-04 ENCOUNTER — Encounter (INDEPENDENT_AMBULATORY_CARE_PROVIDER_SITE_OTHER): Payer: Self-pay

## 2020-01-11 ENCOUNTER — Encounter (INDEPENDENT_AMBULATORY_CARE_PROVIDER_SITE_OTHER): Payer: Self-pay

## 2020-01-18 ENCOUNTER — Encounter (INDEPENDENT_AMBULATORY_CARE_PROVIDER_SITE_OTHER): Payer: Self-pay

## 2020-01-18 DIAGNOSIS — J301 Allergic rhinitis due to pollen: Secondary | ICD-10-CM | POA: Diagnosis not present

## 2020-01-18 DIAGNOSIS — J3089 Other allergic rhinitis: Secondary | ICD-10-CM | POA: Diagnosis not present

## 2020-01-25 ENCOUNTER — Encounter (INDEPENDENT_AMBULATORY_CARE_PROVIDER_SITE_OTHER): Payer: Self-pay

## 2020-02-08 ENCOUNTER — Encounter (INDEPENDENT_AMBULATORY_CARE_PROVIDER_SITE_OTHER): Payer: Self-pay

## 2020-02-14 DIAGNOSIS — J3089 Other allergic rhinitis: Secondary | ICD-10-CM | POA: Diagnosis not present

## 2020-02-14 DIAGNOSIS — J301 Allergic rhinitis due to pollen: Secondary | ICD-10-CM | POA: Diagnosis not present

## 2020-02-15 ENCOUNTER — Encounter (INDEPENDENT_AMBULATORY_CARE_PROVIDER_SITE_OTHER): Payer: Self-pay

## 2020-02-15 DIAGNOSIS — Z20822 Contact with and (suspected) exposure to covid-19: Secondary | ICD-10-CM | POA: Diagnosis not present

## 2020-02-29 ENCOUNTER — Encounter (INDEPENDENT_AMBULATORY_CARE_PROVIDER_SITE_OTHER): Payer: Self-pay

## 2020-03-05 DIAGNOSIS — L0109 Other impetigo: Secondary | ICD-10-CM | POA: Diagnosis not present

## 2020-03-05 DIAGNOSIS — D1801 Hemangioma of skin and subcutaneous tissue: Secondary | ICD-10-CM | POA: Diagnosis not present

## 2020-03-06 ENCOUNTER — Encounter (HOSPITAL_COMMUNITY): Payer: Self-pay

## 2020-03-07 ENCOUNTER — Encounter (INDEPENDENT_AMBULATORY_CARE_PROVIDER_SITE_OTHER): Payer: Self-pay

## 2020-03-13 DIAGNOSIS — J3089 Other allergic rhinitis: Secondary | ICD-10-CM | POA: Diagnosis not present

## 2020-03-13 DIAGNOSIS — R928 Other abnormal and inconclusive findings on diagnostic imaging of breast: Secondary | ICD-10-CM | POA: Diagnosis not present

## 2020-03-13 DIAGNOSIS — J301 Allergic rhinitis due to pollen: Secondary | ICD-10-CM | POA: Diagnosis not present

## 2020-03-14 ENCOUNTER — Encounter (INDEPENDENT_AMBULATORY_CARE_PROVIDER_SITE_OTHER): Payer: Self-pay

## 2020-03-17 IMAGING — CR DG CHEST 2V
2 series · 2 of 2 positions shown · non-contrast
Comparison: None.

CLINICAL DATA: Productive cough no fever

EXAM:
CHEST - 2 VIEW

[w chest pa]
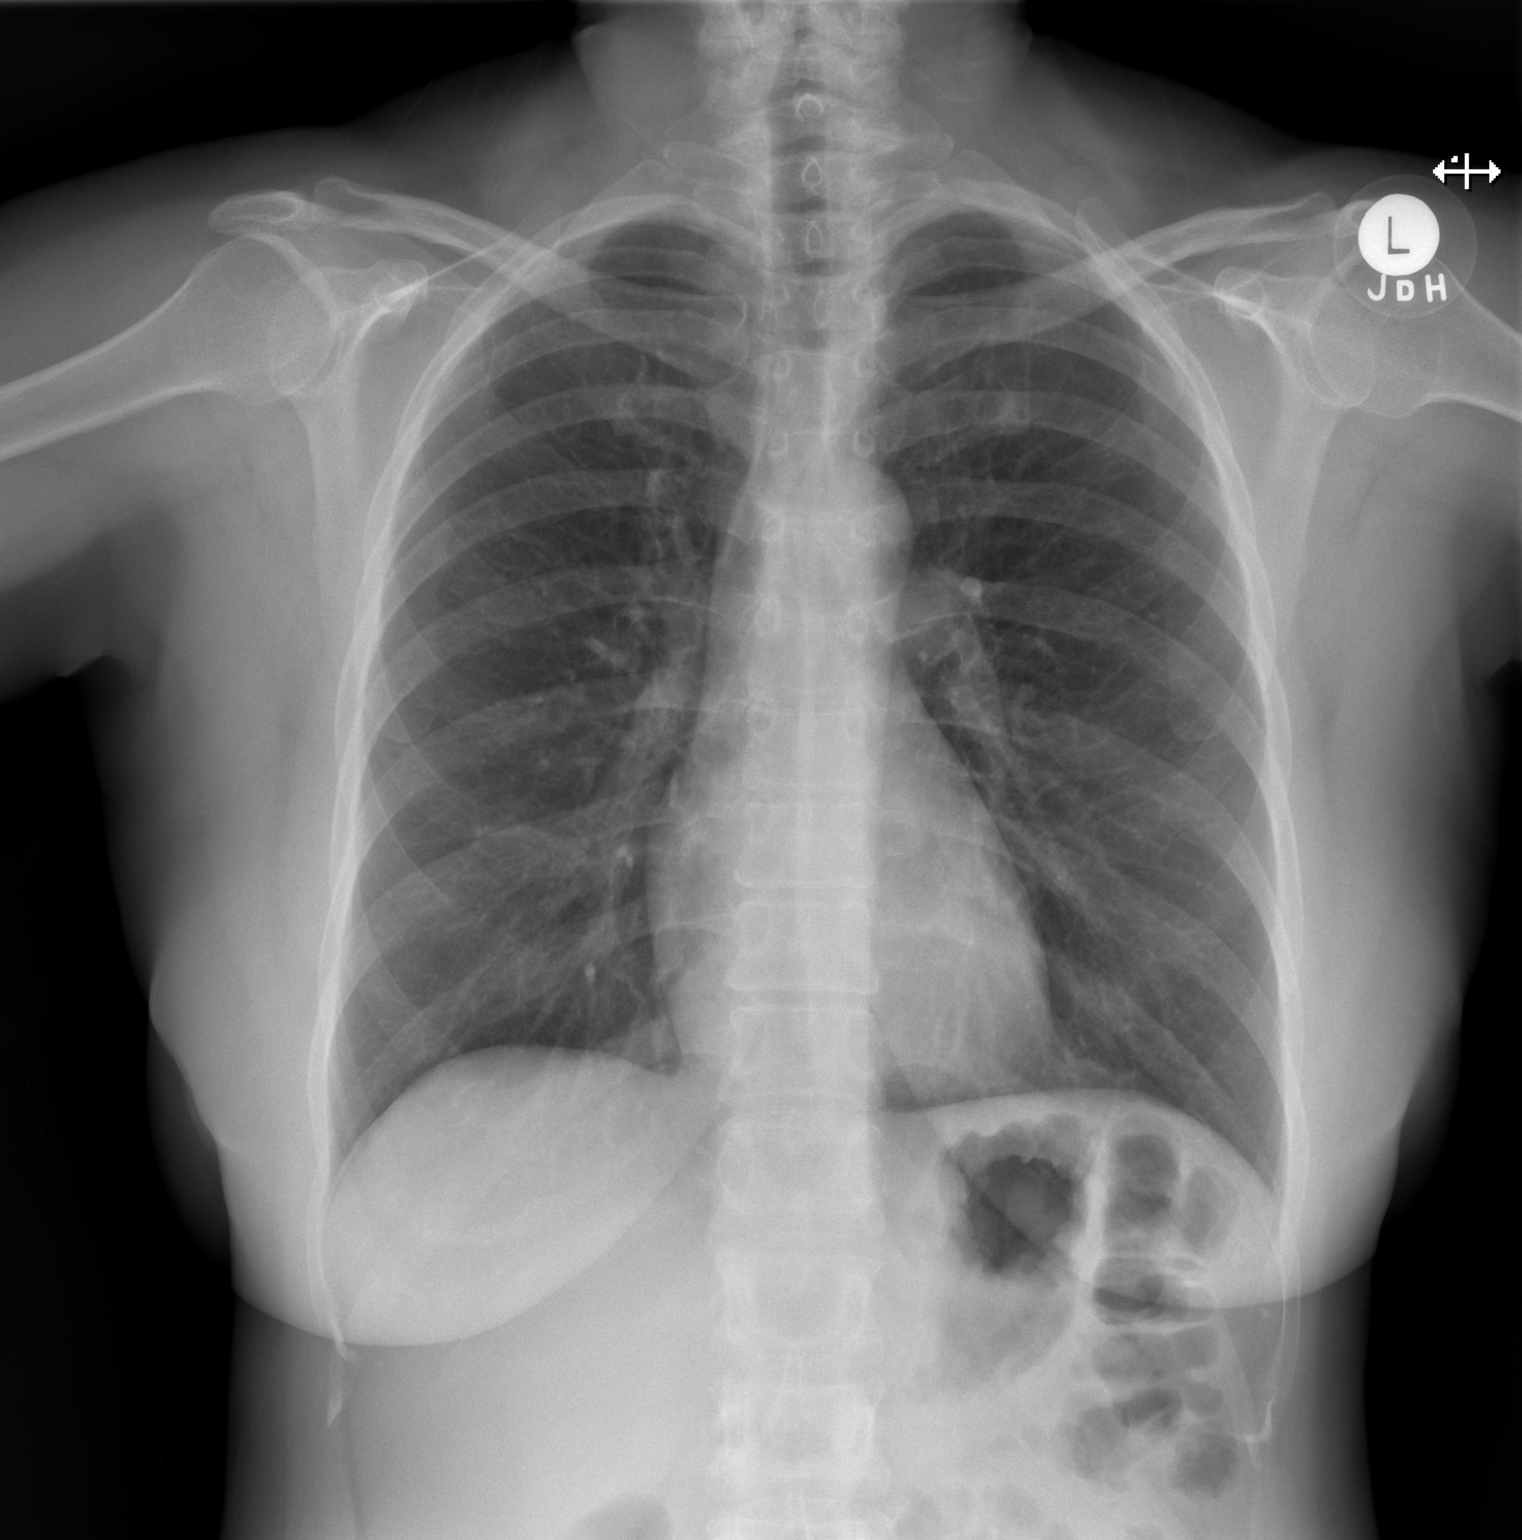

[w chest lat]
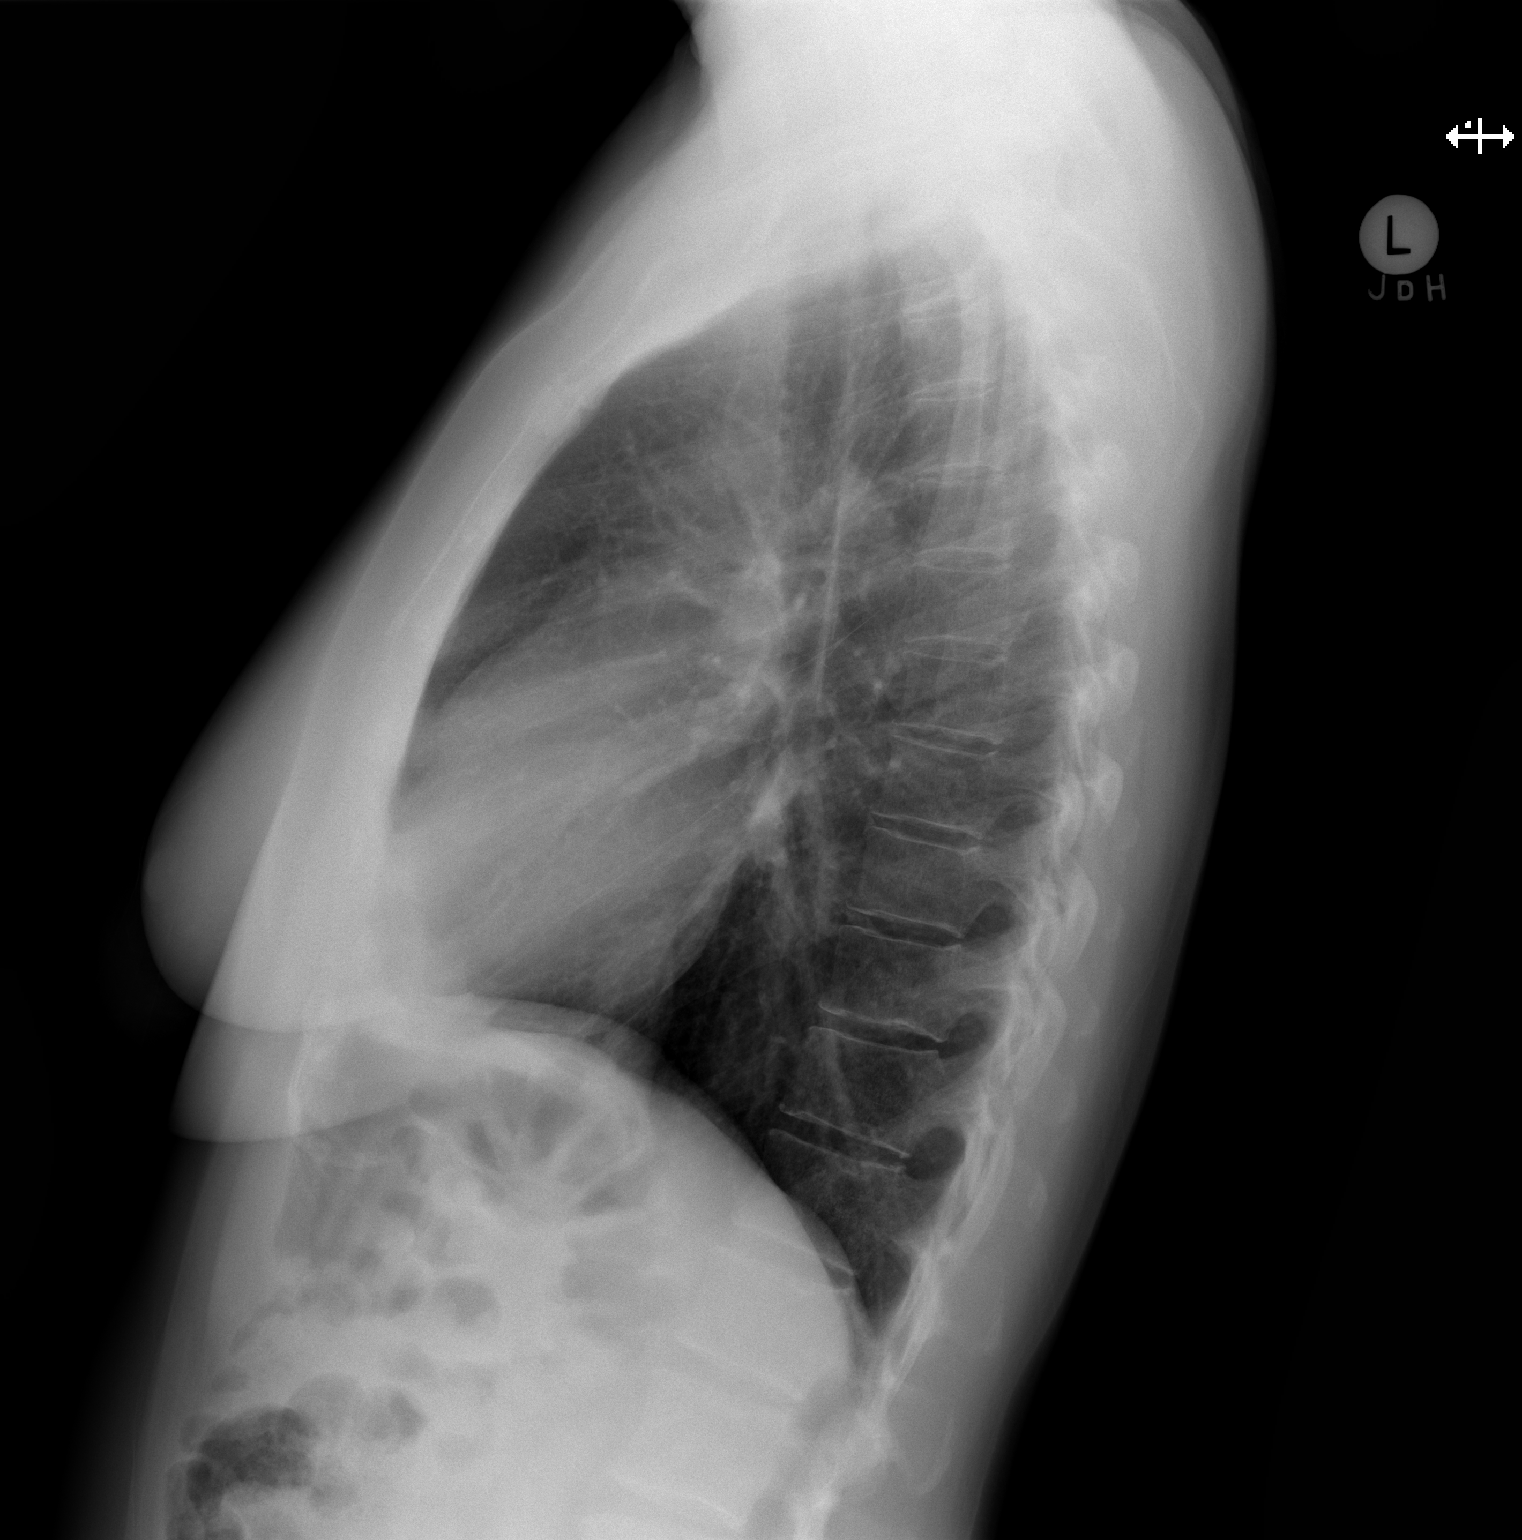

[2 of 2 positions shown; findings below may reference images not displayed]

FINDINGS: The heart size and mediastinal contours are within normal limits.
Both lungs are clear. The visualized skeletal structures are
unremarkable.
IMPRESSION: No active cardiopulmonary disease.

## 2020-03-26 ENCOUNTER — Other Ambulatory Visit: Payer: Self-pay | Admitting: Adult Health

## 2020-03-27 ENCOUNTER — Telehealth: Payer: Self-pay | Admitting: Hematology and Oncology

## 2020-03-27 NOTE — Telephone Encounter (Signed)
Scheduled appt per 2/15 sch msg - unable to reach pt - left message for pateint with appt date and time

## 2020-03-28 ENCOUNTER — Encounter (INDEPENDENT_AMBULATORY_CARE_PROVIDER_SITE_OTHER): Payer: Self-pay

## 2020-04-04 ENCOUNTER — Encounter (INDEPENDENT_AMBULATORY_CARE_PROVIDER_SITE_OTHER): Payer: Self-pay

## 2020-04-04 DIAGNOSIS — B373 Candidiasis of vulva and vagina: Secondary | ICD-10-CM | POA: Diagnosis not present

## 2020-04-04 DIAGNOSIS — N760 Acute vaginitis: Secondary | ICD-10-CM | POA: Diagnosis not present

## 2020-04-09 DIAGNOSIS — J3089 Other allergic rhinitis: Secondary | ICD-10-CM | POA: Diagnosis not present

## 2020-04-09 DIAGNOSIS — J301 Allergic rhinitis due to pollen: Secondary | ICD-10-CM | POA: Diagnosis not present

## 2020-04-10 ENCOUNTER — Encounter (INDEPENDENT_AMBULATORY_CARE_PROVIDER_SITE_OTHER): Payer: Self-pay

## 2020-04-11 ENCOUNTER — Encounter (INDEPENDENT_AMBULATORY_CARE_PROVIDER_SITE_OTHER): Payer: Self-pay

## 2020-04-18 ENCOUNTER — Encounter (INDEPENDENT_AMBULATORY_CARE_PROVIDER_SITE_OTHER): Payer: Self-pay

## 2020-04-25 ENCOUNTER — Encounter (INDEPENDENT_AMBULATORY_CARE_PROVIDER_SITE_OTHER): Payer: Self-pay

## 2020-05-02 ENCOUNTER — Encounter (INDEPENDENT_AMBULATORY_CARE_PROVIDER_SITE_OTHER): Payer: Self-pay

## 2020-05-06 ENCOUNTER — Telehealth: Payer: Self-pay

## 2020-05-06 MED ORDER — PANTOPRAZOLE SODIUM 40 MG PO TBEC: 40.0000 mg | DELAYED_RELEASE_TABLET | Freq: Every day | ORAL | 6 refills | Status: DC

## 2020-05-06 NOTE — Telephone Encounter (Signed)
Refill requested from patient's pharmacy for Pantoprazole. Refills sent in.

## 2020-05-09 ENCOUNTER — Encounter (INDEPENDENT_AMBULATORY_CARE_PROVIDER_SITE_OTHER): Payer: Self-pay

## 2020-05-09 DIAGNOSIS — J301 Allergic rhinitis due to pollen: Secondary | ICD-10-CM | POA: Diagnosis not present

## 2020-05-09 DIAGNOSIS — J3089 Other allergic rhinitis: Secondary | ICD-10-CM | POA: Diagnosis not present

## 2020-05-23 ENCOUNTER — Encounter (INDEPENDENT_AMBULATORY_CARE_PROVIDER_SITE_OTHER): Payer: Self-pay

## 2020-05-29 DIAGNOSIS — E7800 Pure hypercholesterolemia, unspecified: Secondary | ICD-10-CM | POA: Diagnosis not present

## 2020-05-30 ENCOUNTER — Encounter (INDEPENDENT_AMBULATORY_CARE_PROVIDER_SITE_OTHER): Payer: Self-pay

## 2020-06-05 DIAGNOSIS — Z1339 Encounter for screening examination for other mental health and behavioral disorders: Secondary | ICD-10-CM | POA: Diagnosis not present

## 2020-06-05 DIAGNOSIS — E7800 Pure hypercholesterolemia, unspecified: Secondary | ICD-10-CM | POA: Diagnosis not present

## 2020-06-05 DIAGNOSIS — J3089 Other allergic rhinitis: Secondary | ICD-10-CM | POA: Diagnosis not present

## 2020-06-05 DIAGNOSIS — Z0000 Encounter for general adult medical examination without abnormal findings: Secondary | ICD-10-CM | POA: Diagnosis not present

## 2020-06-05 DIAGNOSIS — J301 Allergic rhinitis due to pollen: Secondary | ICD-10-CM | POA: Diagnosis not present

## 2020-06-05 DIAGNOSIS — Z1331 Encounter for screening for depression: Secondary | ICD-10-CM | POA: Diagnosis not present

## 2020-06-13 ENCOUNTER — Encounter (INDEPENDENT_AMBULATORY_CARE_PROVIDER_SITE_OTHER): Payer: Self-pay

## 2020-06-17 DIAGNOSIS — Z780 Asymptomatic menopausal state: Secondary | ICD-10-CM | POA: Diagnosis not present

## 2020-06-18 ENCOUNTER — Encounter: Payer: Self-pay | Admitting: Hematology and Oncology

## 2020-07-05 DIAGNOSIS — J301 Allergic rhinitis due to pollen: Secondary | ICD-10-CM | POA: Diagnosis not present

## 2020-07-05 DIAGNOSIS — J3089 Other allergic rhinitis: Secondary | ICD-10-CM | POA: Diagnosis not present

## 2020-07-11 DIAGNOSIS — Z124 Encounter for screening for malignant neoplasm of cervix: Secondary | ICD-10-CM | POA: Diagnosis not present

## 2020-07-11 DIAGNOSIS — N858 Other specified noninflammatory disorders of uterus: Secondary | ICD-10-CM | POA: Diagnosis not present

## 2020-07-11 DIAGNOSIS — N950 Postmenopausal bleeding: Secondary | ICD-10-CM | POA: Diagnosis not present

## 2020-07-23 ENCOUNTER — Other Ambulatory Visit: Payer: Self-pay | Admitting: Adult Health

## 2020-07-29 ENCOUNTER — Other Ambulatory Visit: Payer: BLUE CROSS/BLUE SHIELD

## 2020-08-01 ENCOUNTER — Ambulatory Visit
Admission: RE | Admit: 2020-08-01 | Discharge: 2020-08-01 | Disposition: A | Discharge status: Home / Self Care | Payer: BLUE CROSS/BLUE SHIELD | Source: Ambulatory Visit | Attending: Hematology and Oncology | Admitting: Hematology and Oncology

## 2020-08-01 ENCOUNTER — Other Ambulatory Visit: Payer: Self-pay | Admitting: Hematology and Oncology

## 2020-08-01 DIAGNOSIS — Z803 Family history of malignant neoplasm of breast: Secondary | ICD-10-CM | POA: Diagnosis not present

## 2020-08-01 DIAGNOSIS — N6311 Unspecified lump in the right breast, upper outer quadrant: Secondary | ICD-10-CM | POA: Diagnosis not present

## 2020-08-01 DIAGNOSIS — N6091 Unspecified benign mammary dysplasia of right breast: Secondary | ICD-10-CM

## 2020-08-01 DIAGNOSIS — R9389 Abnormal findings on diagnostic imaging of other specified body structures: Secondary | ICD-10-CM

## 2020-08-01 MED ORDER — GADOBUTROL 1 MMOL/ML IV SOLN
8.0000 mL | Freq: Once | INTRAVENOUS | Status: AC | PRN
  Administered 2020-08-01: 8 mL via INTRAVENOUS

## 2020-08-02 DIAGNOSIS — J301 Allergic rhinitis due to pollen: Secondary | ICD-10-CM | POA: Diagnosis not present

## 2020-08-03 DIAGNOSIS — Z20822 Contact with and (suspected) exposure to covid-19: Secondary | ICD-10-CM | POA: Diagnosis not present

## 2020-08-05 DIAGNOSIS — J3089 Other allergic rhinitis: Secondary | ICD-10-CM | POA: Diagnosis not present

## 2020-08-06 ENCOUNTER — Other Ambulatory Visit: Payer: BLUE CROSS/BLUE SHIELD

## 2020-08-15 DIAGNOSIS — J301 Allergic rhinitis due to pollen: Secondary | ICD-10-CM | POA: Diagnosis not present

## 2020-08-15 DIAGNOSIS — J3089 Other allergic rhinitis: Secondary | ICD-10-CM | POA: Diagnosis not present

## 2020-08-19 ENCOUNTER — Ambulatory Visit
Admission: RE | Admit: 2020-08-19 | Discharge: 2020-08-19 | Disposition: A | Discharge status: Home / Self Care | Payer: BLUE CROSS/BLUE SHIELD | Source: Ambulatory Visit | Attending: Hematology and Oncology | Admitting: Hematology and Oncology

## 2020-08-19 ENCOUNTER — Other Ambulatory Visit: Payer: Self-pay

## 2020-08-19 ENCOUNTER — Other Ambulatory Visit: Payer: Self-pay | Admitting: Hematology and Oncology

## 2020-08-19 DIAGNOSIS — R9389 Abnormal findings on diagnostic imaging of other specified body structures: Secondary | ICD-10-CM

## 2020-08-19 DIAGNOSIS — J3089 Other allergic rhinitis: Secondary | ICD-10-CM | POA: Diagnosis not present

## 2020-08-19 DIAGNOSIS — J301 Allergic rhinitis due to pollen: Secondary | ICD-10-CM | POA: Diagnosis not present

## 2020-08-19 DIAGNOSIS — N6311 Unspecified lump in the right breast, upper outer quadrant: Secondary | ICD-10-CM | POA: Diagnosis not present

## 2020-08-19 DIAGNOSIS — N6313 Unspecified lump in the right breast, lower outer quadrant: Secondary | ICD-10-CM | POA: Diagnosis not present

## 2020-08-19 DIAGNOSIS — N63 Unspecified lump in unspecified breast: Secondary | ICD-10-CM

## 2020-08-22 DIAGNOSIS — J301 Allergic rhinitis due to pollen: Secondary | ICD-10-CM | POA: Diagnosis not present

## 2020-08-22 DIAGNOSIS — J3089 Other allergic rhinitis: Secondary | ICD-10-CM | POA: Diagnosis not present

## 2020-08-25 NOTE — Progress Notes (Signed)
Patient Care Team: Tisovec, Fransico Him, MD as PCP - General (Internal Medicine)  DIAGNOSIS:    ICD-10-CM   1. Atypical lobular hyperplasia (ALH) of right breast  N60.91       CHIEF COMPLIANT: Follow-up of high risk for breast cancer  INTERVAL HISTORY: Lindsay Blair is a 60 y.o. with above-mentioned history of high risk for breast cancer, currently on antiestrogen therapy with tamoxifen. Mammogram on 08/01/20 showed two small indeterminate enhancing masses in the upper outer and upper central right breast measuring 5 mm and 4 mm with no evidence of malignancy in the left breast. [place] Right Breast on 08/19/20 showed a benign intramammary lymph node within the right breast at the 9 o'clock axis, 13 cm from the nipple, measuring 5 mm, a benign intramammary lymph node within the right breast at the 9 o'clock axis, 6 cm from the nipple, at posterior depth, measuring 6 mm, a benign intramammary lymph node within the RIGHT breast at the 10 o'clock axis, 15 cm from the nipple, and no suspicious solid or cystic masses. She reports to the clinic today for follow-up.   Since she started taking the 10 mg dose of tamoxifen the hot flashes are much better.  She is also on Effexor 37.5.  If she stops Effexor she feels withdrawal type symptoms.  She has been very religious in taking the Effexor.  ALLERGIES:  has No Known Allergies.  MEDICATIONS:  Current Outpatient Medications  Medication Sig Dispense Refill   Bepotastine Besilate (BEPREVE) 1.5 % SOLN Both eyes daily     fluticasone (FLONASE) 50 MCG/ACT nasal spray Place 2 sprays into the nose daily.     loratadine (CLARITIN) 10 MG tablet Take 10 mg by mouth daily.     pantoprazole (PROTONIX) 40 MG tablet Take 1 tablet (40 mg total) by mouth daily before supper. 30 tablet 6   PRESCRIPTION MEDICATION Allergy shots-for pollen once a week   Allergy shots-for dust/mold once a week     tamoxifen (NOLVADEX) 20 MG tablet Take 1 tablet (20 mg total) by mouth  daily. 90 tablet 3   venlafaxine XR (EFFEXOR-XR) 37.5 MG 24 hr capsule TAKE 1 CAPSULE BY MOUTH EVERY DAY WITH BREAKFAST FOR 1 TO 2 WEEKS. INCREASE TO 2 CAPSULES BY MOUTH EVERY DAY 60 capsule 3   Current Facility-Administered Medications  Medication Dose Route Frequency Provider Last Rate Last Admin   0.9 %  sodium chloride infusion  500 mL Intravenous Continuous Ladene Artist, MD        PHYSICAL EXAMINATION: ECOG PERFORMANCE STATUS: 1 - Symptomatic but completely ambulatory  Vitals:   08/26/20 1526  BP: (!) 138/59  Pulse: 82  Resp: 18  Temp: 97.6 F (36.4 C)  SpO2: 99%   Filed Weights   08/26/20 1526  Weight: 166 lb 6.4 oz (75.5 kg)    BREAST: No palpable masses or nodules in either right or left breasts. No palpable axillary supraclavicular or infraclavicular adenopathy no breast tenderness or nipple discharge. (exam performed in the presence of a chaperone)    ASSESSMENT & PLAN:  Atypical lobular hyperplasia (ALH) of right breast Routine screening mammogram detected a 0.8cm area of calcifications in the right breast. Right breast biopsy on 02/06/19 showed a complex sclerosing lesion with calcifications. She underwent a lumpectomy on 04/19/19 with Dr. Marlou Starks for which pathology showed atypical lobular hyperplasia   Atypical lobular hyperplasia: This is characterized by abnormal cells that are filling part of the lobule.  It appears to  increase the risk of breast cancer by 3.75.3 fold.  There is risk of both ipsilateral and contralateral breast cancers.  The cumulative incidence of breast cancer is approximately 1 %/year.    Based upon [employer] breast cancer risk assessment tool: Risk of breast cancer is 29.6% and 10-year risk is 13.4% Current treatment: Tamoxifen 10 mg daily   Tamoxifen toxicities: Severe Hot flashes: Much improved with reducing the dose of tamoxifen to 10 mg as well has Effexor   Breast cancer surveillance: 1.  Annual mammograms planned for December  2021 2. annual breast MRI 08/01/2020: Two small indeterminate enhancing masses in the upper outer and upper central RIGHT breast measuring 5 mm and 4 mm. 3. Ultrasound Right breast: Benign IM LNs F/U MRI and mammogram and ultrasound in 6 months recommended    Return to clinic in 1 year for follow-up    No orders of the defined types were placed in this encounter.  The patient has a good understanding of the overall plan. she agrees with it. she will call with any problems that may develop before the next visit here.  Total time spent: 20 mins including face to face time and time spent for planning, charting and coordination of care  Rulon Eisenmenger, MD, MPH 08/26/2020  I, Thana Ates, am acting as scribe for Dr. Nicholas Lose.  I have reviewed the above documentation for accuracy and completeness, and I agree with the above.

## 2020-08-26 ENCOUNTER — Other Ambulatory Visit: Payer: Self-pay

## 2020-08-26 ENCOUNTER — Inpatient Hospital Stay: Payer: BLUE CROSS/BLUE SHIELD | Attending: Hematology and Oncology | Admitting: Hematology and Oncology

## 2020-08-26 DIAGNOSIS — N6091 Unspecified benign mammary dysplasia of right breast: Secondary | ICD-10-CM

## 2020-08-26 DIAGNOSIS — Z79899 Other long term (current) drug therapy: Secondary | ICD-10-CM | POA: Insufficient documentation

## 2020-08-26 DIAGNOSIS — Z79810 Long term (current) use of selective estrogen receptor modulators (SERMs): Secondary | ICD-10-CM | POA: Diagnosis not present

## 2020-08-26 MED ORDER — TAMOXIFEN CITRATE 20 MG PO TABS: 10.0000 mg | ORAL_TABLET | Freq: Every day | ORAL | 3 refills | Status: DC

## 2020-08-26 NOTE — Assessment & Plan Note (Signed)
Routine screening mammogram detected a 0.8cm area of calcifications in the right breast. Right breast biopsy on 02/06/19 showed a complex sclerosing lesion with calcifications. She underwent a lumpectomy on 04/19/19 with Dr. Marlou Starks for which pathology showed atypical lobular hyperplasia  Atypical lobular hyperplasia: This is characterized by abnormal cells that are filling part of the lobule. It appears to increase the risk of breast cancer by 3.75.3 fold. There is risk of both ipsilateral and contralateral breast cancers. The cumulative incidence of breast cancer is approximately 1 %/year.  Based upon[facility] cancer risk assessment tool: Risk of breast cancer is 29.6% and 10-year risk is 13.4% Current treatment: Tamoxifen 20 mg daily  Tamoxifen toxicities: Severe Hot flashes   Breast cancer surveillance: 1.  Annual mammograms planned for December 2021 2. annual breast MRI 08/01/2020: Two small indeterminate enhancing masses in the upper outer and upper central RIGHT breast measuring 5 mm and 4 mm. 3. Ultrasound Right breast: Benign IM LNs F/U MRI and mammogram and ultrasound in 6 months recommended   Return to clinic in 1 year for follow-up

## 2020-08-27 ENCOUNTER — Telehealth: Payer: Self-pay | Admitting: Hematology and Oncology

## 2020-08-27 NOTE — Telephone Encounter (Signed)
Scheduled per 7/18 los. Called and spoke with pt confirmed 7/18 appt

## 2020-08-28 DIAGNOSIS — J3089 Other allergic rhinitis: Secondary | ICD-10-CM | POA: Diagnosis not present

## 2020-08-28 DIAGNOSIS — J301 Allergic rhinitis due to pollen: Secondary | ICD-10-CM | POA: Diagnosis not present

## 2020-08-30 DIAGNOSIS — J301 Allergic rhinitis due to pollen: Secondary | ICD-10-CM | POA: Diagnosis not present

## 2020-08-30 DIAGNOSIS — J3089 Other allergic rhinitis: Secondary | ICD-10-CM | POA: Diagnosis not present

## 2020-09-26 DIAGNOSIS — J301 Allergic rhinitis due to pollen: Secondary | ICD-10-CM | POA: Diagnosis not present

## 2020-09-26 DIAGNOSIS — J3089 Other allergic rhinitis: Secondary | ICD-10-CM | POA: Diagnosis not present

## 2020-10-20 ENCOUNTER — Other Ambulatory Visit: Payer: Self-pay | Admitting: Physician Assistant

## 2020-10-22 DIAGNOSIS — J3089 Other allergic rhinitis: Secondary | ICD-10-CM | POA: Diagnosis not present

## 2020-10-22 DIAGNOSIS — J301 Allergic rhinitis due to pollen: Secondary | ICD-10-CM | POA: Diagnosis not present

## 2020-10-22 DIAGNOSIS — J3081 Allergic rhinitis due to animal (cat) (dog) hair and dander: Secondary | ICD-10-CM | POA: Diagnosis not present

## 2020-10-22 DIAGNOSIS — H1045 Other chronic allergic conjunctivitis: Secondary | ICD-10-CM | POA: Diagnosis not present

## 2020-10-24 DIAGNOSIS — J301 Allergic rhinitis due to pollen: Secondary | ICD-10-CM | POA: Insufficient documentation

## 2020-10-24 DIAGNOSIS — H1045 Other chronic allergic conjunctivitis: Secondary | ICD-10-CM | POA: Insufficient documentation

## 2020-10-24 DIAGNOSIS — J3081 Allergic rhinitis due to animal (cat) (dog) hair and dander: Secondary | ICD-10-CM | POA: Insufficient documentation

## 2020-10-29 ENCOUNTER — Other Ambulatory Visit: Payer: Self-pay | Admitting: Hematology and Oncology

## 2020-10-29 DIAGNOSIS — J3089 Other allergic rhinitis: Secondary | ICD-10-CM | POA: Diagnosis not present

## 2020-10-29 DIAGNOSIS — J301 Allergic rhinitis due to pollen: Secondary | ICD-10-CM | POA: Diagnosis not present

## 2020-11-01 ENCOUNTER — Other Ambulatory Visit: Payer: Self-pay

## 2020-11-01 ENCOUNTER — Encounter: Payer: Self-pay | Admitting: Gastroenterology

## 2020-11-01 ENCOUNTER — Ambulatory Visit (AMBULATORY_SURGERY_CENTER): Payer: BLUE CROSS/BLUE SHIELD

## 2020-11-01 VITALS — Ht 64.0 in | Wt 166.0 lb

## 2020-11-01 DIAGNOSIS — Z86010 Personal history of colonic polyps: Secondary | ICD-10-CM

## 2020-11-01 DIAGNOSIS — Z800 Family history of malignant neoplasm of digestive organs: Secondary | ICD-10-CM

## 2020-11-01 MED ORDER — PLENVU 140 G PO SOLR: 1.0000 | ORAL | 0 refills | Status: DC

## 2020-11-01 NOTE — Progress Notes (Signed)

## 2020-11-06 ENCOUNTER — Other Ambulatory Visit: Payer: Self-pay | Admitting: Hematology and Oncology

## 2020-11-15 ENCOUNTER — Encounter: Payer: Self-pay | Admitting: Gastroenterology

## 2020-11-15 ENCOUNTER — Ambulatory Visit (AMBULATORY_SURGERY_CENTER): Payer: BLUE CROSS/BLUE SHIELD | Admitting: Gastroenterology

## 2020-11-15 ENCOUNTER — Other Ambulatory Visit: Payer: Self-pay

## 2020-11-15 VITALS — BP 121/61 | HR 70 | Temp 96.0°F | Resp 19 | Ht 64.0 in | Wt 166.0 lb

## 2020-11-15 DIAGNOSIS — Z800 Family history of malignant neoplasm of digestive organs: Secondary | ICD-10-CM | POA: Diagnosis not present

## 2020-11-15 DIAGNOSIS — Z86010 Personal history of colonic polyps: Secondary | ICD-10-CM

## 2020-11-15 DIAGNOSIS — D123 Benign neoplasm of transverse colon: Secondary | ICD-10-CM | POA: Diagnosis not present

## 2020-11-15 DIAGNOSIS — Z1211 Encounter for screening for malignant neoplasm of colon: Secondary | ICD-10-CM | POA: Diagnosis not present

## 2020-11-15 MED ORDER — SODIUM CHLORIDE 0.9 % IV SOLN
500.0000 mL | Freq: Once | INTRAVENOUS | Status: DC
Start: 2020-11-15 — End: 2020-11-15

## 2020-11-15 NOTE — Progress Notes (Signed)
Report to PACU, RN, vss, BBS= Clear.  

## 2020-11-15 NOTE — Progress Notes (Signed)
History & Physical  Primary Care Physician:  Tisovec, Fransico Him, MD Primary Gastroenterologist: Lucio Edward, MD  CHIEF COMPLAINT:  Personal history of colon polyps, family history of colon cancer  HPI: Lindsay Blair is a 60 y.o. female presenting for colonoscopy for surveillance of adenomatous colon polyps and for family history of colon cancer.   Past Medical History:  Diagnosis Date   Allergy    Anxiety    Complication of anesthesia    Family history of colon cancer    sister- Dx age 62, father colon cancer died at age 52   GERD (gastroesophageal reflux disease)    Hx of adenomatous colonic polyps    PONV (postoperative nausea and vomiting)     Past Surgical History:  Procedure Laterality Date   BREAST LUMPECTOMY WITH RADIOACTIVE SEED LOCALIZATION Right 04/19/2019   Procedure: RIGHT BREAST LUMPECTOMY WITH RADIOACTIVE SEED LOCALIZATION;  Surgeon: Jovita Kussmaul, MD;  Location: [facility];  Service: General;  Laterality: Right;   [place]    Prior to Admission medications   Medication Sig Start Date End Date Taking? Authorizing Provider  cetirizine (ZYRTEC) 10 MG tablet    Yes [provider]  clindamycin (CLEOCIN T) 1 % lotion SMARTSIG:Sparingly Topical Twice Daily PRN 10/18/20  Yes [provider]  fluticasone (FLONASE) 50 MCG/ACT nasal spray Place 2 sprays into the nose daily.   Yes [provider]  Olopatadine HCl 0.2 % SOLN 1 drop into affected eye 10/22/20  Yes [provider]  pantoprazole (PROTONIX) 40 MG tablet TAKE 1 TABLET(40 MG) BY MOUTH DAILY BEFORE AND SUPPER 10/21/20  Yes Esterwood, Amy S, PA-C  tamoxifen (NOLVADEX) 20 MG tablet TAKE 1 TABLET(20 MG) BY MOUTH DAILY 11/06/20  Yes Nicholas Lose, MD  Turmeric 500 MG CAPS daily.   Yes [provider]  venlafaxine XR (EFFEXOR-XR) 37.5 MG 24 hr capsule TAKE 1 CAPSULE BY MOUTH EVERY DAY WITH BREAKFAST FOR 1 TO 2 WEEKS.  INCREASE TO 2 CAPSULES BY MOUTH EVERY DAY 10/29/20  Yes Nicholas Lose, MD  Vitamin D, Cholecalciferol, 25 MCG (1000 UT) CAPS daily.   Yes [provider]  EPINEPHrine 0.3 mg/0.3 mL IJ SOAJ injection See admin instructions.    [provider]  loratadine (CLARITIN) 10 MG tablet Take 10 mg by mouth daily. Patient not taking: Reported on 11/15/2020    [provider]  PRESCRIPTION MEDICATION Allergy shots-for pollen once a week   Allergy shots-for dust/mold once a week    [provider]    Current Outpatient Medications  Medication Sig Dispense Refill   cetirizine (ZYRTEC) 10 MG tablet      clindamycin (CLEOCIN T) 1 % lotion SMARTSIG:Sparingly Topical Twice Daily PRN     fluticasone (FLONASE) 50 MCG/ACT nasal spray Place 2 sprays into the nose daily.     Olopatadine HCl 0.2 % SOLN 1 drop into affected eye     pantoprazole (PROTONIX) 40 MG tablet TAKE 1 TABLET(40 MG) BY MOUTH DAILY BEFORE AND SUPPER 90 tablet 0   tamoxifen (NOLVADEX) 20 MG tablet TAKE 1 TABLET(20 MG) BY MOUTH DAILY 90 tablet 3   Turmeric 500 MG CAPS daily.     venlafaxine XR (EFFEXOR-XR) 37.5 MG 24 hr capsule TAKE 1 CAPSULE BY MOUTH EVERY DAY WITH BREAKFAST FOR 1 TO 2 WEEKS. INCREASE TO 2 CAPSULES BY MOUTH EVERY DAY 60 capsule 3   Vitamin D, Cholecalciferol, 25 MCG (  1000 UT) CAPS daily.     EPINEPHrine 0.3 mg/0.3 mL IJ SOAJ injection See admin instructions.     loratadine (CLARITIN) 10 MG tablet Take 10 mg by mouth daily. (Patient not taking: Reported on 11/15/2020)     PRESCRIPTION MEDICATION Allergy shots-for pollen once a week   Allergy shots-for dust/mold once a week     Current Facility-Administered Medications  Medication Dose Route Frequency Provider Last Rate Last Admin   0.9 %  sodium chloride infusion  500 mL Intravenous Once Ladene Artist, MD        Allergies as of 11/15/2020 - Review Complete 11/15/2020  Allergen Reaction Noted   Other  11/15/2020    Family History   Problem Relation Age of Onset   Breast cancer Mother    Colon polyps Father    Colon cancer Father 10   Colon cancer Sister 82   Esophageal cancer Neg Hx    Stomach cancer Neg Hx    Rectal cancer Neg Hx     Social History   Socioeconomic History   Marital status: Married    Spouse name: Not on file   Number of children: Not on file   Years of education: Not on file   Highest education level: Not on file  Occupational History   Not on file  Tobacco Use   Smoking status: Never   Smokeless tobacco: Never  Vaping Use   Vaping Use: Never used  Substance and Sexual Activity   Alcohol use: Yes    Alcohol/week: 5.0 standard drinks    Types: 5 Glasses of wine per week   Drug use: No   Sexual activity: Not on file  Other Topics Concern   Not on file  Social History Narrative   Not on file   Social Determinants of Health   Financial Resource Strain: Not on file  Food Insecurity: Not on file  Transportation Needs: Not on file  Physical Activity: Not on file  Stress: Not on file  Social Connections: Not on file  Intimate Partner Violence: Not on file    Review of Systems:  All systems reviewed an negative except where noted in HPI.  Gen: Denies any fever, chills, sweats, anorexia, fatigue, weakness, malaise, weight loss, and sleep disorder CV: Denies chest pain, angina, palpitations, syncope, orthopnea, PND, peripheral edema, and claudication. Resp: Denies dyspnea at rest, dyspnea with exercise, cough, sputum, wheezing, coughing up blood, and pleurisy. GI: Denies vomiting blood, jaundice, and fecal incontinence.   Denies dysphagia or odynophagia. GU : Denies urinary burning, blood in urine, urinary frequency, urinary hesitancy, nocturnal urination, and urinary incontinence. MS: Denies joint pain, limitation of movement, and swelling, stiffness, low back pain, extremity pain. Denies muscle weakness, cramps, atrophy.  Derm: Denies rash, itching, dry skin, hives, moles,  warts, or unhealing ulcers.  Psych: Denies depression, anxiety, memory loss, suicidal ideation, hallucinations, paranoia, and confusion. Heme: Denies bruising, bleeding, and enlarged lymph nodes. Neuro:  Denies any headaches, dizziness, paresthesias. Endo:  Denies any problems with DM, thyroid, adrenal function.   Physical Exam: General:  Alert, well-developed, in NAD Head:  Normocephalic and atraumatic. Eyes:  Sclera clear, no icterus.   Conjunctiva pink. Ears:  Normal auditory acuity. Mouth:  No deformity or lesions.  Neck:  Supple; no masses . Lungs:  Clear throughout to auscultation.   No wheezes, crackles, or rhonchi. No acute distress. Heart:  Regular rate and rhythm; no murmurs. Abdomen:  Soft, nondistended, nontender. No masses, hepatomegaly. No obvious masses.  Normal bowel .    Rectal:  Deferred   Msk:  Symmetrical without gross deformities.. Pulses:  Normal pulses noted. Extremities:  Without edema. Neurologic:  Alert and  oriented x4;  grossly normal neurologically. Skin:  Intact without significant lesions or rashes. Cervical Nodes:  No significant cervical adenopathy. Psych:  Alert and cooperative. Normal mood and affect.   Impression / Plan:   Personal history of adenomatous colon polyps and family history of colon cancer, father at 13 and sister at 40.    This patient is appropriate for endoscopic procedures in the ambulatory setting.    Pricilla Riffle. Fuller Plan  11/15/2020, 8:34 AM See Shea Evans, [facility], to contact our on call provider

## 2020-11-15 NOTE — Op Note (Signed)
[facility] Patient Name: Caleyah Jr Procedure Date: 11/15/2020 8:30 AM MRN: [id number] Endoscopist: Ladene Artist , MD Age: 60 Referring MD:  Date of Birth: Dec 29, 1960 Gender: Female Account #: [id number] Procedure:                Colonoscopy Indications:              Surveillance: Personal history of adenomatous                            polyps on last colonoscopy 5 years ago. Family                            history of colon cancer, father and sister. Medicines:                Monitored Anesthesia Care Procedure:                Pre-Anesthesia Assessment:                           - Prior to the procedure, a History and Physical                            was performed, and patient medications and                            allergies were reviewed. The patient's tolerance of                            previous anesthesia was also reviewed. The risks                            and benefits of the procedure and the sedation                            options and risks were discussed with the patient.                            All questions were answered, and informed consent                            was obtained. Prior Anticoagulants: The patient has                            taken no previous anticoagulant or antiplatelet                            agents. ASA Grade Assessment: II - A patient with                            mild systemic disease. After reviewing the risks                            and benefits, the patient was deemed in  satisfactory condition to undergo the procedure.                           After obtaining informed consent, the colonoscope                            was passed under direct vision. Throughout the                            procedure, the patient's blood pressure, pulse, and                            oxygen saturations were monitored continuously. The                            CF [id number] #[id number]  was introduced through the anus                            and advanced to the the cecum, identified by                            appendiceal orifice and ileocecal valve. The                            ileocecal valve, appendiceal orifice, and rectum                            were photographed. The quality of the bowel                            preparation was excellent. The colonoscopy was                            performed without difficulty. The patient tolerated                            the procedure well. Scope In: 8:36:52 AM Scope Out: 8:54:45 AM Scope Withdrawal Time: 0 hours 14 minutes 43 seconds  Total Procedure Duration: 0 hours 17 minutes 53 seconds  Findings:                 The perianal and digital rectal examinations were                            normal except for small external tags.                           Three sessile polyps were found in the transverse                            colon. The polyps were 5 to 10 mm in size. These                            polyps were removed with a cold snare. Resection  and retrieval were complete.                           Multiple small-mouthed diverticula were found in                            the left colon. There was no evidence of                            diverticular bleeding.                           The exam was otherwise without abnormality on                            direct and retroflexion views. Complications:            No immediate complications. Estimated blood loss:                            None. Estimated Blood Loss:     Estimated blood loss: none. Impression:               - Three 5 to 10 mm polyps in the transverse colon,                            removed with a cold snare. Resected and retrieved.                           - Mild diverticulosis in the left colon.                           - The examination was otherwise normal on direct                            and  retroflexion views. Recommendation:           - Repeat colonoscopy after studies are complete for                            surveillance based on pathology results.                           - Patient has a contact number available for                            emergencies. The signs and symptoms of potential                            delayed complications were discussed with the                            patient. Return to normal activities tomorrow.                            Written discharge instructions were provided to the  patient.                           - High fiber diet.                           - Continue present medications.                           - Await pathology results. Ladene Artist, MD 11/15/2020 9:03:39 AM This report has been signed electronically.

## 2020-11-15 NOTE — Progress Notes (Signed)
Called to room to assist during endoscopic procedure.  Patient ID and intended procedure confirmed with present staff. Received instructions for my participation in the procedure from the performing physician.  

## 2020-11-15 NOTE — Patient Instructions (Signed)
Handouts given on diverticulosis and polyps. Await pathology results. High fiber diet recommended. Repeat colonoscopy in 3 years for surveillance.   YOU HAD AN ENDOSCOPIC PROCEDURE TODAY AT [facility]:   Refer to the procedure report that was given to you for any specific questions about what was found during the examination.  If the procedure report does not answer your questions, please call your gastroenterologist to clarify.  If you requested that your care partner not be given the details of your procedure findings, then the procedure report has been included in a sealed envelope for you to review at your convenience later.  YOU SHOULD EXPECT: Some feelings of bloating in the abdomen. Passage of more gas than usual.  Walking can help get rid of the air that was put into your GI tract during the procedure and reduce the bloating. If you had a lower endoscopy (such as a colonoscopy or flexible sigmoidoscopy) you may notice spotting of blood in your stool or on the toilet paper. If you underwent a bowel prep for your procedure, you may not have a normal bowel movement for a few days.  Please Note:  You might notice some irritation and congestion in your nose or some drainage.  This is from the oxygen used during your procedure.  There is no need for concern and it should clear up in a day or so.  SYMPTOMS TO REPORT IMMEDIATELY:  Following lower endoscopy (colonoscopy or flexible sigmoidoscopy):  Excessive amounts of blood in the stool  Significant tenderness or worsening of abdominal pains  Swelling of the abdomen that is new, acute  Fever of 100F or higher   For urgent or emergent issues, a gastroenterologist can be reached at any hour by calling [phone]. Do not use MyChart messaging for urgent concerns.    DIET:  We do recommend a small meal at first, but then you may proceed to your regular diet.  Drink plenty of fluids but you should avoid alcoholic beverages  for 24 hours.  ACTIVITY:  You should plan to take it easy for the rest of today and you should NOT DRIVE or use heavy machinery until tomorrow (because of the sedation medicines used during the test).    FOLLOW UP: Our staff will call the number listed on your records 48-72 hours following your procedure to check on you and address any questions or concerns that you may have regarding the information given to you following your procedure. If we do not reach you, we will leave a message.  We will attempt to reach you two times.  During this call, we will ask if you have developed any symptoms of COVID 19. If you develop any symptoms (ie: fever, flu-like symptoms, shortness of breath, cough etc.) before then, please call [phone].  If you test positive for Covid 19 in the 2 weeks post procedure, please call and report this information to [place].    If any biopsies were taken you will be contacted by phone or by letter within the next 1-3 weeks.  Please call us at [phone] if you have not heard about the biopsies in 3 weeks.    SIGNATURES/CONFIDENTIALITY: You and/or your care partner have signed paperwork which will be entered into your electronic medical record.  These signatures attest to the fact that that the information above on your After Visit Summary has been reviewed and is understood.  Full responsibility of the confidentiality of this discharge information lies with you  and/or your care-partner.

## 2020-11-15 NOTE — Progress Notes (Signed)
VS-SM  Pt's states no medical or surgical changes since previsit or office visit.  

## 2020-11-19 ENCOUNTER — Telehealth: Payer: Self-pay

## 2020-11-19 ENCOUNTER — Telehealth: Payer: Self-pay | Admitting: *Deleted

## 2020-11-19 NOTE — Telephone Encounter (Signed)
  Follow up Call-  Call back number 11/15/2020  Post procedure Call Back phone  # [phone]  Permission to leave phone message Yes  Some recent data might be hidden     Patient questions:  Do you have a fever, pain , or abdominal swelling? No. Pain Score  0 *  Have you tolerated food without any problems? Yes.    Have you been able to return to your normal activities? Yes.    Do you have any questions about your discharge instructions: Diet   No. Medications  No. Follow up visit  No.  Do you have questions or concerns about your Care? No.  Actions: * If pain score is 4 or above: No action needed, pain <4.  Have you developed a fever since your procedure? no  2.   Have you had an respiratory symptoms (SOB or cough) since your procedure? no  3.   Have you tested positive for COVID 19 since your procedure no  4.   Have you had any family members/close contacts diagnosed with the COVID 19 since your procedure?  no   If yes to any of these questions please route to Joylene John, RN and Joella Prince, RN

## 2020-11-19 NOTE — Telephone Encounter (Signed)
No answer, left message to call back later today, B.Binh Doten RN. 

## 2020-11-25 DIAGNOSIS — J301 Allergic rhinitis due to pollen: Secondary | ICD-10-CM | POA: Diagnosis not present

## 2020-11-25 DIAGNOSIS — J3089 Other allergic rhinitis: Secondary | ICD-10-CM | POA: Diagnosis not present

## 2020-12-03 ENCOUNTER — Encounter: Payer: Self-pay | Admitting: Gastroenterology

## 2020-12-26 DIAGNOSIS — J301 Allergic rhinitis due to pollen: Secondary | ICD-10-CM | POA: Diagnosis not present

## 2020-12-26 DIAGNOSIS — J3089 Other allergic rhinitis: Secondary | ICD-10-CM | POA: Diagnosis not present

## 2021-01-07 ENCOUNTER — Ambulatory Visit
Admission: RE | Admit: 2021-01-07 | Discharge: 2021-01-07 | Disposition: A | Discharge status: Home / Self Care | Payer: BLUE CROSS/BLUE SHIELD | Source: Ambulatory Visit | Attending: Hematology and Oncology | Admitting: Hematology and Oncology

## 2021-01-07 DIAGNOSIS — R928 Other abnormal and inconclusive findings on diagnostic imaging of breast: Secondary | ICD-10-CM | POA: Diagnosis not present

## 2021-01-07 DIAGNOSIS — N6091 Unspecified benign mammary dysplasia of right breast: Secondary | ICD-10-CM

## 2021-01-07 MED ORDER — GADOBUTROL 1 MMOL/ML IV SOLN
7.0000 mL | Freq: Once | INTRAVENOUS | Status: AC | PRN
  Administered 2021-01-07: 7 mL via INTRAVENOUS

## 2021-01-23 DIAGNOSIS — J3089 Other allergic rhinitis: Secondary | ICD-10-CM | POA: Diagnosis not present

## 2021-01-23 DIAGNOSIS — J301 Allergic rhinitis due to pollen: Secondary | ICD-10-CM | POA: Diagnosis not present

## 2021-02-21 ENCOUNTER — Ambulatory Visit
Admission: RE | Admit: 2021-02-21 | Discharge: 2021-02-21 | Disposition: A | Discharge status: Home / Self Care | Payer: 59 | Source: Ambulatory Visit | Attending: Hematology and Oncology | Admitting: Hematology and Oncology

## 2021-02-21 ENCOUNTER — Other Ambulatory Visit: Payer: Self-pay | Admitting: Hematology and Oncology

## 2021-02-21 DIAGNOSIS — N63 Unspecified lump in unspecified breast: Secondary | ICD-10-CM

## 2021-02-24 ENCOUNTER — Other Ambulatory Visit: Payer: Self-pay | Admitting: Hematology and Oncology

## 2021-04-17 ENCOUNTER — Other Ambulatory Visit: Payer: Self-pay | Admitting: Hematology and Oncology

## 2021-07-19 ENCOUNTER — Other Ambulatory Visit: Payer: Self-pay | Admitting: Hematology and Oncology

## 2021-08-13 NOTE — Progress Notes (Signed)
Patient Care Team: Tisovec, Fransico Him, MD as PCP - General (Internal Medicine)  DIAGNOSIS:  Encounter Diagnosis  Name Primary?   Atypical lobular hyperplasia (ALH) of right breast Yes      CHIEF COMPLIANT: Follow-up of high risk for breast cancer on Tamoxifen    INTERVAL HISTORY: Lindsay Blair is a 61 y.o. with above-mentioned history of high risk for breast cancer, currently on antiestrogen therapy with tamoxifen.  presents to the clinic today for a follow-up She presents to the clinic for an annual follow-up. She states that the hot flashes are better. Sh said if she mis a dose she has withdrawals and get ringing in ears. She started taking half of the tamoxifen and she is tolerating it much better.    ALLERGIES:  is allergic to other.  MEDICATIONS:  Current Outpatient Medications  Medication Sig Dispense Refill   cetirizine (ZYRTEC) 10 MG tablet      clindamycin (CLEOCIN T) 1 % lotion SMARTSIG:Sparingly Topical Twice Daily PRN     EPINEPHrine 0.3 mg/0.3 mL IJ SOAJ injection See admin instructions.     fluticasone (FLONASE) 50 MCG/ACT nasal spray Place 2 sprays into the nose daily.     loratadine (CLARITIN) 10 MG tablet Take 10 mg by mouth daily. (Patient not taking: Reported on 11/15/2020)     Olopatadine HCl 0.2 % SOLN 1 drop into affected eye     pantoprazole (PROTONIX) 40 MG tablet TAKE 1 TABLET(40 MG) BY MOUTH DAILY BEFORE AND SUPPER 90 tablet 0   PRESCRIPTION MEDICATION Allergy shots-for pollen once a week   Allergy shots-for dust/mold once a week     tamoxifen (NOLVADEX) 20 MG tablet Take 0.5 tablets (10 mg total) by mouth daily.     Turmeric 500 MG CAPS daily.     venlafaxine XR (EFFEXOR-XR) 37.5 MG 24 hr capsule TAKE 1 CAPSULE BY MOUTH EVERY DAY WITH BF FOR 1-2 WEEKS THEN INCREASE TO 2 CAP DAILY 180 capsule 0   Vitamin D, Cholecalciferol, 25 MCG (1000 UT) CAPS daily.     No current facility-administered medications for this visit.    PHYSICAL  EXAMINATION: ECOG PERFORMANCE STATUS: 1 - Symptomatic but completely ambulatory  Vitals:   08/26/21 1445  BP: (!) 153/72  Pulse: 88  Resp: 18  Temp: 97.9 F (36.6 C)  SpO2: 99%   Filed Weights   08/26/21 1445  Weight: 167 lb 4.8 oz (75.9 kg)    BREAST: No palpable masses or nodules in either right or left breasts. No palpable axillary supraclavicular or infraclavicular adenopathy no breast tenderness or nipple discharge. (exam performed in the presence of a chaperone)   ASSESSMENT & PLAN:  Atypical lobular hyperplasia (ALH) of right breast Routine screening mammogram detected a 0.8cm area of calcifications in the right breast. Right breast biopsy on 02/06/19 showed a complex sclerosing lesion with calcifications. She underwent a lumpectomy on 04/19/19 with Dr. Marlou Starks for which pathology showed atypical lobular hyperplasia   Atypical lobular hyperplasia:    Based upon Ty[employer] Cusick breast cancer risk assessment tool: Risk of breast cancer is 29.6% and 10-year risk is 13.4% Current treatment: Tamoxifen 10 mg daily   Tamoxifen toxicities: Severe Hot flashes: Much improved with reducing the dose of tamoxifen to 10 mg as well has Effexor   Breast cancer surveillance: 1.  Annual mammograms 02/21/2021: Unchanged benign intraparenchymal lymph node, benign density category B 2. annual breast MRI  01/08/2021: Stable intramammary lymph nodes, breast density category A 3. Ultrasound Right breast:  02/21/2021 benign IM LNs Breast MRI will be ordered for August 2023. Mammograms to be done in December 2023.  Return to clinic in 1 year for follow-up      Orders Placed This Encounter  Procedures   MR BREAST BILATERAL [facility] [place] CAD    Standing Status:   Future    Standing Expiration Date:   08/27/2022    Order Specific Question:   If indicated for the ordered procedure, I authorize the administration of contrast media per Radiology protocol    Answer:   Yes    Order Specific Question:    What is the patient's sedation requirement?    Answer:   No Sedation    Order Specific Question:   Does the patient have a pacemaker or implanted devices?    Answer:   No    Order Specific Question:   Preferred imaging location?    Answer:   [facility] (table limit - 550 lbs)   The patient has a good understanding of the overall plan. she agrees with it. she will call with any problems that may develop before the next visit here. Total time spent: 30 mins including face to face time and time spent for planning, charting and co-ordination of care   Harriette Ohara, MD 08/26/21    I Gardiner Coins am scribing for Dr. Lindi Adie  I have reviewed the above documentation for accuracy and completeness, and I agree with the above.

## 2021-08-26 ENCOUNTER — Inpatient Hospital Stay: Payer: 59 | Attending: Hematology and Oncology | Admitting: Hematology and Oncology

## 2021-08-26 VITALS — BP 153/72 | HR 88 | Temp 97.9°F | Resp 18 | Ht 64.0 in | Wt 167.3 lb

## 2021-08-26 DIAGNOSIS — N6091 Unspecified benign mammary dysplasia of right breast: Secondary | ICD-10-CM | POA: Diagnosis present

## 2021-08-26 DIAGNOSIS — Z79810 Long term (current) use of selective estrogen receptor modulators (SERMs): Secondary | ICD-10-CM | POA: Insufficient documentation

## 2021-08-26 MED ORDER — TAMOXIFEN CITRATE 20 MG PO TABS: 10.0000 mg | ORAL_TABLET | Freq: Every day | ORAL | Status: DC

## 2021-08-26 NOTE — Assessment & Plan Note (Addendum)
Routine screening mammogram detected a 0.8cm area of calcifications in the right breast. Right breast biopsy on 02/06/19 showed a complex sclerosing lesion with calcifications. She underwent a lumpectomy on 04/19/19 with Dr. Marlou Starks for which pathology showed atypical lobular hyperplasia  Atypical lobular hyperplasia:  Based uponTyrer Cusickbreast cancer risk assessment tool: Risk of breast cancer is 29.6% and 10-year risk is 13.4% Current treatment: Tamoxifen10 mg daily  Tamoxifen toxicities: Severe Hot flashes: Much improved with reducing the dose of tamoxifen to 10 mg as well has Effexor  Breast cancer surveillance: 1.Annual mammograms 02/21/2021: Unchanged benign intraparenchymal lymph node, benign density category B 2.annual breast MRI  01/08/2021: Stable intramammary lymph nodes, breast density category A 3. Ultrasound Right breast: 02/21/2021 benign IM LNs Breast MRI will be ordered for August 2023. Mammograms to be done in December 2023.  Return to clinic in 1 year for follow-up

## 2021-08-27 ENCOUNTER — Telehealth: Payer: Self-pay | Admitting: Hematology and Oncology

## 2021-08-27 NOTE — Telephone Encounter (Signed)
Scheduled appointment per 7/18 los. Left voicemail.

## 2021-09-30 ENCOUNTER — Ambulatory Visit (HOSPITAL_COMMUNITY)
Admission: RE | Admit: 2021-09-30 | Discharge: 2021-09-30 | Disposition: A | Discharge status: Home / Self Care | Payer: 59 | Source: Ambulatory Visit | Attending: Hematology and Oncology | Admitting: Hematology and Oncology

## 2021-09-30 DIAGNOSIS — N6091 Unspecified benign mammary dysplasia of right breast: Secondary | ICD-10-CM | POA: Diagnosis present

## 2021-09-30 MED ORDER — GADOBUTROL 1 MMOL/ML IV SOLN
7.5000 mL | Freq: Once | INTRAVENOUS | Status: AC | PRN
  Administered 2021-09-30: 7.5 mL via INTRAVENOUS

## 2021-10-23 ENCOUNTER — Other Ambulatory Visit: Payer: Self-pay | Admitting: Hematology and Oncology

## 2021-11-11 ENCOUNTER — Other Ambulatory Visit: Payer: Self-pay | Admitting: *Deleted

## 2021-11-11 MED ORDER — TAMOXIFEN CITRATE 20 MG PO TABS: 10.0000 mg | ORAL_TABLET | Freq: Every day | ORAL | 3 refills | Status: DC

## 2022-01-22 ENCOUNTER — Other Ambulatory Visit: Payer: Self-pay | Admitting: Hematology and Oncology

## 2022-02-12 DIAGNOSIS — J301 Allergic rhinitis due to pollen: Secondary | ICD-10-CM | POA: Diagnosis not present

## 2022-02-12 DIAGNOSIS — J3081 Allergic rhinitis due to animal (cat) (dog) hair and dander: Secondary | ICD-10-CM | POA: Diagnosis not present

## 2022-02-12 DIAGNOSIS — J3089 Other allergic rhinitis: Secondary | ICD-10-CM | POA: Diagnosis not present

## 2022-02-17 DIAGNOSIS — J3089 Other allergic rhinitis: Secondary | ICD-10-CM | POA: Diagnosis not present

## 2022-02-17 DIAGNOSIS — J3081 Allergic rhinitis due to animal (cat) (dog) hair and dander: Secondary | ICD-10-CM | POA: Diagnosis not present

## 2022-02-17 DIAGNOSIS — J301 Allergic rhinitis due to pollen: Secondary | ICD-10-CM | POA: Diagnosis not present

## 2022-02-26 DIAGNOSIS — J301 Allergic rhinitis due to pollen: Secondary | ICD-10-CM | POA: Diagnosis not present

## 2022-02-26 DIAGNOSIS — J3089 Other allergic rhinitis: Secondary | ICD-10-CM | POA: Diagnosis not present

## 2022-02-26 DIAGNOSIS — J3081 Allergic rhinitis due to animal (cat) (dog) hair and dander: Secondary | ICD-10-CM | POA: Diagnosis not present

## 2022-03-02 DIAGNOSIS — J3089 Other allergic rhinitis: Secondary | ICD-10-CM | POA: Diagnosis not present

## 2022-03-02 DIAGNOSIS — J3081 Allergic rhinitis due to animal (cat) (dog) hair and dander: Secondary | ICD-10-CM | POA: Diagnosis not present

## 2022-03-02 DIAGNOSIS — J301 Allergic rhinitis due to pollen: Secondary | ICD-10-CM | POA: Diagnosis not present

## 2022-03-05 DIAGNOSIS — J301 Allergic rhinitis due to pollen: Secondary | ICD-10-CM | POA: Diagnosis not present

## 2022-03-05 DIAGNOSIS — J3089 Other allergic rhinitis: Secondary | ICD-10-CM | POA: Diagnosis not present

## 2022-03-05 DIAGNOSIS — J3081 Allergic rhinitis due to animal (cat) (dog) hair and dander: Secondary | ICD-10-CM | POA: Diagnosis not present

## 2022-03-09 DIAGNOSIS — J3089 Other allergic rhinitis: Secondary | ICD-10-CM | POA: Diagnosis not present

## 2022-03-09 DIAGNOSIS — J3081 Allergic rhinitis due to animal (cat) (dog) hair and dander: Secondary | ICD-10-CM | POA: Diagnosis not present

## 2022-03-09 DIAGNOSIS — J301 Allergic rhinitis due to pollen: Secondary | ICD-10-CM | POA: Diagnosis not present

## 2022-03-11 DIAGNOSIS — J3089 Other allergic rhinitis: Secondary | ICD-10-CM | POA: Diagnosis not present

## 2022-03-11 DIAGNOSIS — J3081 Allergic rhinitis due to animal (cat) (dog) hair and dander: Secondary | ICD-10-CM | POA: Diagnosis not present

## 2022-03-11 DIAGNOSIS — J301 Allergic rhinitis due to pollen: Secondary | ICD-10-CM | POA: Diagnosis not present

## 2022-03-13 DIAGNOSIS — J3089 Other allergic rhinitis: Secondary | ICD-10-CM | POA: Diagnosis not present

## 2022-03-13 DIAGNOSIS — J3081 Allergic rhinitis due to animal (cat) (dog) hair and dander: Secondary | ICD-10-CM | POA: Diagnosis not present

## 2022-03-13 DIAGNOSIS — J301 Allergic rhinitis due to pollen: Secondary | ICD-10-CM | POA: Diagnosis not present

## 2022-03-16 DIAGNOSIS — J3081 Allergic rhinitis due to animal (cat) (dog) hair and dander: Secondary | ICD-10-CM | POA: Diagnosis not present

## 2022-03-16 DIAGNOSIS — J301 Allergic rhinitis due to pollen: Secondary | ICD-10-CM | POA: Diagnosis not present

## 2022-03-16 DIAGNOSIS — J3089 Other allergic rhinitis: Secondary | ICD-10-CM | POA: Diagnosis not present

## 2022-03-18 DIAGNOSIS — J301 Allergic rhinitis due to pollen: Secondary | ICD-10-CM | POA: Diagnosis not present

## 2022-03-18 DIAGNOSIS — J3089 Other allergic rhinitis: Secondary | ICD-10-CM | POA: Diagnosis not present

## 2022-03-18 DIAGNOSIS — J3081 Allergic rhinitis due to animal (cat) (dog) hair and dander: Secondary | ICD-10-CM | POA: Diagnosis not present

## 2022-03-20 DIAGNOSIS — J3089 Other allergic rhinitis: Secondary | ICD-10-CM | POA: Diagnosis not present

## 2022-03-20 DIAGNOSIS — J301 Allergic rhinitis due to pollen: Secondary | ICD-10-CM | POA: Diagnosis not present

## 2022-03-20 DIAGNOSIS — J3081 Allergic rhinitis due to animal (cat) (dog) hair and dander: Secondary | ICD-10-CM | POA: Diagnosis not present

## 2022-03-24 DIAGNOSIS — J301 Allergic rhinitis due to pollen: Secondary | ICD-10-CM | POA: Diagnosis not present

## 2022-03-24 DIAGNOSIS — J3081 Allergic rhinitis due to animal (cat) (dog) hair and dander: Secondary | ICD-10-CM | POA: Diagnosis not present

## 2022-03-24 DIAGNOSIS — J3089 Other allergic rhinitis: Secondary | ICD-10-CM | POA: Diagnosis not present

## 2022-03-30 DIAGNOSIS — J3081 Allergic rhinitis due to animal (cat) (dog) hair and dander: Secondary | ICD-10-CM | POA: Diagnosis not present

## 2022-03-30 DIAGNOSIS — J301 Allergic rhinitis due to pollen: Secondary | ICD-10-CM | POA: Diagnosis not present

## 2022-03-30 DIAGNOSIS — J3089 Other allergic rhinitis: Secondary | ICD-10-CM | POA: Diagnosis not present

## 2022-04-02 DIAGNOSIS — J301 Allergic rhinitis due to pollen: Secondary | ICD-10-CM | POA: Diagnosis not present

## 2022-04-02 DIAGNOSIS — J3081 Allergic rhinitis due to animal (cat) (dog) hair and dander: Secondary | ICD-10-CM | POA: Diagnosis not present

## 2022-04-02 DIAGNOSIS — J3089 Other allergic rhinitis: Secondary | ICD-10-CM | POA: Diagnosis not present

## 2022-04-07 DIAGNOSIS — J301 Allergic rhinitis due to pollen: Secondary | ICD-10-CM | POA: Diagnosis not present

## 2022-04-07 DIAGNOSIS — J3089 Other allergic rhinitis: Secondary | ICD-10-CM | POA: Diagnosis not present

## 2022-04-07 DIAGNOSIS — J3081 Allergic rhinitis due to animal (cat) (dog) hair and dander: Secondary | ICD-10-CM | POA: Diagnosis not present

## 2022-04-10 DIAGNOSIS — J3081 Allergic rhinitis due to animal (cat) (dog) hair and dander: Secondary | ICD-10-CM | POA: Diagnosis not present

## 2022-04-10 DIAGNOSIS — J301 Allergic rhinitis due to pollen: Secondary | ICD-10-CM | POA: Diagnosis not present

## 2022-04-10 DIAGNOSIS — J3089 Other allergic rhinitis: Secondary | ICD-10-CM | POA: Diagnosis not present

## 2022-04-15 DIAGNOSIS — J301 Allergic rhinitis due to pollen: Secondary | ICD-10-CM | POA: Diagnosis not present

## 2022-04-15 DIAGNOSIS — J3081 Allergic rhinitis due to animal (cat) (dog) hair and dander: Secondary | ICD-10-CM | POA: Diagnosis not present

## 2022-04-15 DIAGNOSIS — J3089 Other allergic rhinitis: Secondary | ICD-10-CM | POA: Diagnosis not present

## 2022-04-20 DIAGNOSIS — Z124 Encounter for screening for malignant neoplasm of cervix: Secondary | ICD-10-CM | POA: Diagnosis not present

## 2022-04-22 DIAGNOSIS — J3089 Other allergic rhinitis: Secondary | ICD-10-CM | POA: Diagnosis not present

## 2022-04-22 DIAGNOSIS — J3081 Allergic rhinitis due to animal (cat) (dog) hair and dander: Secondary | ICD-10-CM | POA: Diagnosis not present

## 2022-04-22 DIAGNOSIS — J301 Allergic rhinitis due to pollen: Secondary | ICD-10-CM | POA: Diagnosis not present

## 2022-04-29 DIAGNOSIS — J301 Allergic rhinitis due to pollen: Secondary | ICD-10-CM | POA: Diagnosis not present

## 2022-04-29 DIAGNOSIS — J3081 Allergic rhinitis due to animal (cat) (dog) hair and dander: Secondary | ICD-10-CM | POA: Diagnosis not present

## 2022-04-29 DIAGNOSIS — J3089 Other allergic rhinitis: Secondary | ICD-10-CM | POA: Diagnosis not present

## 2022-05-06 DIAGNOSIS — J3089 Other allergic rhinitis: Secondary | ICD-10-CM | POA: Diagnosis not present

## 2022-05-06 DIAGNOSIS — J301 Allergic rhinitis due to pollen: Secondary | ICD-10-CM | POA: Diagnosis not present

## 2022-05-06 DIAGNOSIS — J3081 Allergic rhinitis due to animal (cat) (dog) hair and dander: Secondary | ICD-10-CM | POA: Diagnosis not present

## 2022-05-13 DIAGNOSIS — J3089 Other allergic rhinitis: Secondary | ICD-10-CM | POA: Diagnosis not present

## 2022-05-13 DIAGNOSIS — J3081 Allergic rhinitis due to animal (cat) (dog) hair and dander: Secondary | ICD-10-CM | POA: Diagnosis not present

## 2022-05-13 DIAGNOSIS — J301 Allergic rhinitis due to pollen: Secondary | ICD-10-CM | POA: Diagnosis not present

## 2022-05-20 DIAGNOSIS — J3081 Allergic rhinitis due to animal (cat) (dog) hair and dander: Secondary | ICD-10-CM | POA: Diagnosis not present

## 2022-05-20 DIAGNOSIS — J301 Allergic rhinitis due to pollen: Secondary | ICD-10-CM | POA: Diagnosis not present

## 2022-05-20 DIAGNOSIS — J3089 Other allergic rhinitis: Secondary | ICD-10-CM | POA: Diagnosis not present

## 2022-05-26 DIAGNOSIS — J301 Allergic rhinitis due to pollen: Secondary | ICD-10-CM | POA: Diagnosis not present

## 2022-05-26 DIAGNOSIS — J3081 Allergic rhinitis due to animal (cat) (dog) hair and dander: Secondary | ICD-10-CM | POA: Diagnosis not present

## 2022-05-26 DIAGNOSIS — J3089 Other allergic rhinitis: Secondary | ICD-10-CM | POA: Diagnosis not present

## 2022-06-02 DIAGNOSIS — J3081 Allergic rhinitis due to animal (cat) (dog) hair and dander: Secondary | ICD-10-CM | POA: Diagnosis not present

## 2022-06-02 DIAGNOSIS — J3089 Other allergic rhinitis: Secondary | ICD-10-CM | POA: Diagnosis not present

## 2022-06-02 DIAGNOSIS — J301 Allergic rhinitis due to pollen: Secondary | ICD-10-CM | POA: Diagnosis not present

## 2022-06-08 DIAGNOSIS — J301 Allergic rhinitis due to pollen: Secondary | ICD-10-CM | POA: Diagnosis not present

## 2022-06-08 DIAGNOSIS — J3089 Other allergic rhinitis: Secondary | ICD-10-CM | POA: Diagnosis not present

## 2022-06-08 DIAGNOSIS — J3081 Allergic rhinitis due to animal (cat) (dog) hair and dander: Secondary | ICD-10-CM | POA: Diagnosis not present

## 2022-06-16 DIAGNOSIS — J3081 Allergic rhinitis due to animal (cat) (dog) hair and dander: Secondary | ICD-10-CM | POA: Diagnosis not present

## 2022-06-16 DIAGNOSIS — J301 Allergic rhinitis due to pollen: Secondary | ICD-10-CM | POA: Diagnosis not present

## 2022-06-16 DIAGNOSIS — J3089 Other allergic rhinitis: Secondary | ICD-10-CM | POA: Diagnosis not present

## 2022-06-23 DIAGNOSIS — J3081 Allergic rhinitis due to animal (cat) (dog) hair and dander: Secondary | ICD-10-CM | POA: Diagnosis not present

## 2022-06-23 DIAGNOSIS — J301 Allergic rhinitis due to pollen: Secondary | ICD-10-CM | POA: Diagnosis not present

## 2022-06-23 DIAGNOSIS — J3089 Other allergic rhinitis: Secondary | ICD-10-CM | POA: Diagnosis not present

## 2022-06-24 DIAGNOSIS — K219 Gastro-esophageal reflux disease without esophagitis: Secondary | ICD-10-CM | POA: Diagnosis not present

## 2022-06-24 DIAGNOSIS — E7800 Pure hypercholesterolemia, unspecified: Secondary | ICD-10-CM | POA: Diagnosis not present

## 2022-06-30 DIAGNOSIS — J301 Allergic rhinitis due to pollen: Secondary | ICD-10-CM | POA: Diagnosis not present

## 2022-06-30 DIAGNOSIS — J3081 Allergic rhinitis due to animal (cat) (dog) hair and dander: Secondary | ICD-10-CM | POA: Diagnosis not present

## 2022-06-30 DIAGNOSIS — J3089 Other allergic rhinitis: Secondary | ICD-10-CM | POA: Diagnosis not present

## 2022-07-01 DIAGNOSIS — Z803 Family history of malignant neoplasm of breast: Secondary | ICD-10-CM | POA: Diagnosis not present

## 2022-07-01 DIAGNOSIS — Z800 Family history of malignant neoplasm of digestive organs: Secondary | ICD-10-CM | POA: Diagnosis not present

## 2022-07-01 DIAGNOSIS — D126 Benign neoplasm of colon, unspecified: Secondary | ICD-10-CM | POA: Diagnosis not present

## 2022-07-01 DIAGNOSIS — Z0000 Encounter for general adult medical examination without abnormal findings: Secondary | ICD-10-CM | POA: Diagnosis not present

## 2022-07-01 DIAGNOSIS — E7800 Pure hypercholesterolemia, unspecified: Secondary | ICD-10-CM | POA: Diagnosis not present

## 2022-07-01 DIAGNOSIS — Z1331 Encounter for screening for depression: Secondary | ICD-10-CM | POA: Diagnosis not present

## 2022-07-01 DIAGNOSIS — Z1339 Encounter for screening examination for other mental health and behavioral disorders: Secondary | ICD-10-CM | POA: Diagnosis not present

## 2022-07-01 DIAGNOSIS — K219 Gastro-esophageal reflux disease without esophagitis: Secondary | ICD-10-CM | POA: Diagnosis not present

## 2022-07-01 DIAGNOSIS — N6091 Unspecified benign mammary dysplasia of right breast: Secondary | ICD-10-CM | POA: Diagnosis not present

## 2022-07-01 DIAGNOSIS — R82998 Other abnormal findings in urine: Secondary | ICD-10-CM | POA: Diagnosis not present

## 2022-07-01 DIAGNOSIS — J302 Other seasonal allergic rhinitis: Secondary | ICD-10-CM | POA: Diagnosis not present

## 2022-07-07 DIAGNOSIS — J3089 Other allergic rhinitis: Secondary | ICD-10-CM | POA: Diagnosis not present

## 2022-07-07 DIAGNOSIS — J301 Allergic rhinitis due to pollen: Secondary | ICD-10-CM | POA: Diagnosis not present

## 2022-07-07 DIAGNOSIS — Z1231 Encounter for screening mammogram for malignant neoplasm of breast: Secondary | ICD-10-CM | POA: Diagnosis not present

## 2022-07-07 DIAGNOSIS — J3081 Allergic rhinitis due to animal (cat) (dog) hair and dander: Secondary | ICD-10-CM | POA: Diagnosis not present

## 2022-07-14 DIAGNOSIS — J3089 Other allergic rhinitis: Secondary | ICD-10-CM | POA: Diagnosis not present

## 2022-07-14 DIAGNOSIS — J3081 Allergic rhinitis due to animal (cat) (dog) hair and dander: Secondary | ICD-10-CM | POA: Diagnosis not present

## 2022-07-14 DIAGNOSIS — J301 Allergic rhinitis due to pollen: Secondary | ICD-10-CM | POA: Diagnosis not present

## 2022-07-21 DIAGNOSIS — J3089 Other allergic rhinitis: Secondary | ICD-10-CM | POA: Diagnosis not present

## 2022-07-21 DIAGNOSIS — J3081 Allergic rhinitis due to animal (cat) (dog) hair and dander: Secondary | ICD-10-CM | POA: Diagnosis not present

## 2022-07-21 DIAGNOSIS — J301 Allergic rhinitis due to pollen: Secondary | ICD-10-CM | POA: Diagnosis not present

## 2022-07-30 DIAGNOSIS — J3081 Allergic rhinitis due to animal (cat) (dog) hair and dander: Secondary | ICD-10-CM | POA: Diagnosis not present

## 2022-07-30 DIAGNOSIS — J3089 Other allergic rhinitis: Secondary | ICD-10-CM | POA: Diagnosis not present

## 2022-07-30 DIAGNOSIS — J301 Allergic rhinitis due to pollen: Secondary | ICD-10-CM | POA: Diagnosis not present

## 2022-08-04 DIAGNOSIS — J3081 Allergic rhinitis due to animal (cat) (dog) hair and dander: Secondary | ICD-10-CM | POA: Diagnosis not present

## 2022-08-04 DIAGNOSIS — J301 Allergic rhinitis due to pollen: Secondary | ICD-10-CM | POA: Diagnosis not present

## 2022-08-04 DIAGNOSIS — J3089 Other allergic rhinitis: Secondary | ICD-10-CM | POA: Diagnosis not present

## 2022-08-12 DIAGNOSIS — J301 Allergic rhinitis due to pollen: Secondary | ICD-10-CM | POA: Diagnosis not present

## 2022-08-12 DIAGNOSIS — J3089 Other allergic rhinitis: Secondary | ICD-10-CM | POA: Diagnosis not present

## 2022-08-12 DIAGNOSIS — J3081 Allergic rhinitis due to animal (cat) (dog) hair and dander: Secondary | ICD-10-CM | POA: Diagnosis not present

## 2022-08-17 DIAGNOSIS — J3089 Other allergic rhinitis: Secondary | ICD-10-CM | POA: Diagnosis not present

## 2022-08-17 DIAGNOSIS — J301 Allergic rhinitis due to pollen: Secondary | ICD-10-CM | POA: Diagnosis not present

## 2022-08-17 DIAGNOSIS — J3081 Allergic rhinitis due to animal (cat) (dog) hair and dander: Secondary | ICD-10-CM | POA: Diagnosis not present

## 2022-08-25 ENCOUNTER — Telehealth: Payer: Self-pay | Admitting: Hematology and Oncology

## 2022-08-25 DIAGNOSIS — J3089 Other allergic rhinitis: Secondary | ICD-10-CM | POA: Diagnosis not present

## 2022-08-25 DIAGNOSIS — J301 Allergic rhinitis due to pollen: Secondary | ICD-10-CM | POA: Diagnosis not present

## 2022-08-25 DIAGNOSIS — J3081 Allergic rhinitis due to animal (cat) (dog) hair and dander: Secondary | ICD-10-CM | POA: Diagnosis not present

## 2022-08-25 NOTE — Telephone Encounter (Signed)
 Rescheduled appointment per provider on-call. Patient is aware of the changes made to her upcoming appointment.

## 2022-08-31 ENCOUNTER — Ambulatory Visit: Payer: 59 | Admitting: Hematology and Oncology

## 2022-09-02 NOTE — Progress Notes (Signed)
   Patient Care Team: Tisovec, Adelfa Koh, MD as PCP - General (Internal Medicine)  DIAGNOSIS: No diagnosis found.  SUMMARY OF ONCOLOGIC HISTORY: Oncology History   No history exists.    CHIEF COMPLIANT:   INTERVAL HISTORY: Lindsay Blair is a   ALLERGIES:  is allergic to other.  MEDICATIONS:  Current Outpatient Medications  Medication Sig Dispense Refill   cetirizine (ZYRTEC) 10 MG tablet      clindamycin (CLEOCIN T) 1 % lotion SMARTSIG:Sparingly Topical Twice Daily PRN     EPINEPHrine 0.3 mg/0.3 mL IJ SOAJ injection See admin instructions.     fluticasone (FLONASE) 50 MCG/ACT nasal spray Place 2 sprays into the nose daily.     loratadine (CLARITIN) 10 MG tablet Take 10 mg by mouth daily. (Patient not taking: Reported on 11/15/2020)     Olopatadine HCl 0.2 % SOLN 1 drop into affected eye     pantoprazole (PROTONIX) 40 MG tablet TAKE 1 TABLET(40 MG) BY MOUTH DAILY BEFORE AND SUPPER 90 tablet 0   PRESCRIPTION MEDICATION Allergy shots-for pollen once a week   Allergy shots-for dust/mold once a week     tamoxifen (NOLVADEX) 20 MG tablet Take 0.5 tablets (10 mg total) by mouth daily. 90 tablet 3   Turmeric 500 MG CAPS daily.     venlafaxine XR (EFFEXOR-XR) 37.5 MG 24 hr capsule TAKE 1 CAPSULE BY MOUTH EVERY DAY WITH BF FOR 1-2 WEEKS THEN INCREASE TO 2 CAP DAILY 90 capsule 3   Vitamin D, Cholecalciferol, 25 MCG (1000 UT) CAPS daily.     No current facility-administered medications for this visit.    PHYSICAL EXAMINATION: ECOG PERFORMANCE STATUS: {CHL ONC ECOG PS:[phone]}  There were no vitals filed for this visit. There were no vitals filed for this visit.  BREAST:*** No palpable masses or nodules in either right or left breasts. No palpable axillary supraclavicular or infraclavicular adenopathy no breast tenderness or nipple discharge. (exam performed in the presence of a chaperone)  LABORATORY DATA:  I have reviewed the data as listed     No data to display           No results found for: "WBC", "HGB", "HCT", "MCV", "PLT", "NEUTROABS"  ASSESSMENT & PLAN:  No problem-specific Assessment & Plan notes found for this encounter.    No orders of the defined types were placed in this encounter.  The patient has a good understanding of the overall plan. she agrees with it. she will call with any problems that may develop before the next visit here. Total time spent: 30 mins including face to face time and time spent for planning, charting and co-ordination of care   Sherlyn Lick, CMA 09/02/22    I Lindsay Blair am acting as a Neurosurgeon for [employer]  ***

## 2022-09-03 ENCOUNTER — Inpatient Hospital Stay: Payer: 59 | Attending: Hematology and Oncology | Admitting: Hematology and Oncology

## 2022-09-03 ENCOUNTER — Telehealth: Payer: Self-pay | Admitting: Hematology and Oncology

## 2022-09-03 ENCOUNTER — Other Ambulatory Visit: Payer: Self-pay

## 2022-09-03 VITALS — BP 154/74 | HR 70 | Temp 97.8°F | Resp 18 | Ht 64.0 in | Wt 161.1 lb

## 2022-09-03 DIAGNOSIS — Z79810 Long term (current) use of selective estrogen receptor modulators (SERMs): Secondary | ICD-10-CM | POA: Insufficient documentation

## 2022-09-03 DIAGNOSIS — N6091 Unspecified benign mammary dysplasia of right breast: Secondary | ICD-10-CM | POA: Diagnosis not present

## 2022-09-03 MED ORDER — VEOZAH 45 MG PO TABS: 1.0000 | ORAL_TABLET | Freq: Every day | ORAL | 11 refills | Status: DC

## 2022-09-03 NOTE — Assessment & Plan Note (Addendum)
Routine screening mammogram detected a 0.8cm area of calcifications in the right breast. Right breast biopsy on 02/06/19 showed a complex sclerosing lesion with calcifications. She underwent a lumpectomy on 04/19/19 with Dr. Carolynne Edouard for which pathology showed atypical lobular hyperplasia   Atypical lobular hyperplasia:    Based upon Tyrer Cusick breast cancer risk assessment tool: Risk of breast cancer is 29.6% and 10-year risk is 13.4% Current treatment: Tamoxifen 5 mg daily   Tamoxifen toxicities: Severe Hot flashes: She stopped taking Effexor.  We sent a prescription for Veozah.   Breast cancer surveillance: 1.  Annual mammograms 02/21/2021: Unchanged benign intraparenchymal lymph node, benign density category B 2. annual breast MRI 09/30/2021: Benign, breast density category A 3. Ultrasound Right breast: 02/21/2021 benign IM LNs   Return to clinic in 1 year for follow-up

## 2022-09-03 NOTE — Telephone Encounter (Signed)
Scheduled appointment per 7/25 los. Patient is aware of the made appointment.

## 2022-09-08 DIAGNOSIS — J301 Allergic rhinitis due to pollen: Secondary | ICD-10-CM | POA: Diagnosis not present

## 2022-09-08 DIAGNOSIS — J3089 Other allergic rhinitis: Secondary | ICD-10-CM | POA: Diagnosis not present

## 2022-09-08 DIAGNOSIS — H1045 Other chronic allergic conjunctivitis: Secondary | ICD-10-CM | POA: Diagnosis not present

## 2022-09-08 DIAGNOSIS — J3081 Allergic rhinitis due to animal (cat) (dog) hair and dander: Secondary | ICD-10-CM | POA: Diagnosis not present

## 2022-09-08 DIAGNOSIS — R052 Subacute cough: Secondary | ICD-10-CM | POA: Diagnosis not present

## 2022-09-15 ENCOUNTER — Ambulatory Visit (INDEPENDENT_AMBULATORY_CARE_PROVIDER_SITE_OTHER): Payer: 59 | Admitting: Physician Assistant

## 2022-09-15 ENCOUNTER — Encounter: Payer: Self-pay | Admitting: Physician Assistant

## 2022-09-15 VITALS — BP 122/68 | HR 68 | Ht 64.0 in | Wt 161.0 lb

## 2022-09-15 DIAGNOSIS — Z86010 Personal history of colonic polyps: Secondary | ICD-10-CM

## 2022-09-15 DIAGNOSIS — K219 Gastro-esophageal reflux disease without esophagitis: Secondary | ICD-10-CM | POA: Diagnosis not present

## 2022-09-15 DIAGNOSIS — Z800 Family history of malignant neoplasm of digestive organs: Secondary | ICD-10-CM | POA: Diagnosis not present

## 2022-09-15 DIAGNOSIS — J3081 Allergic rhinitis due to animal (cat) (dog) hair and dander: Secondary | ICD-10-CM | POA: Diagnosis not present

## 2022-09-15 DIAGNOSIS — J3089 Other allergic rhinitis: Secondary | ICD-10-CM | POA: Diagnosis not present

## 2022-09-15 DIAGNOSIS — J301 Allergic rhinitis due to pollen: Secondary | ICD-10-CM | POA: Diagnosis not present

## 2022-09-15 MED ORDER — PANTOPRAZOLE SODIUM 40 MG PO TBEC: 40.0000 mg | DELAYED_RELEASE_TABLET | Freq: Every day | ORAL | 11 refills | Status: DC

## 2022-09-15 MED ORDER — PANTOPRAZOLE SODIUM 40 MG PO TBEC: 40.0000 mg | DELAYED_RELEASE_TABLET | Freq: Every day | ORAL | 11 refills | Status: AC

## 2022-09-15 NOTE — Progress Notes (Signed)
Subjective:    Patient ID: Lindsay Blair, female    DOB: 1960-06-28, 62 y.o.   MRN: [id number]  HPI  Lindsay Blair is a 62 year old female, established with Dr. Russella Dar, who was last seen in October 2022 when she underwent colonoscopy.  She comes in today for follow-up of GERD and med refills. She has history of chronic GERD and has been on Protonix 40 mg p.o. every morning for several years.  She says she has run out of the medication recently, and every time she runs out will have a flare of her symptoms that persist until she can get back on medication.  She does try to follow a good antireflux regimen.  She feels that Protonix works well when she is on it.  No complaints of dysphagia or odynophagia. She has family history of colon cancer in both her father and her sister. At last colonoscopy October 2022 she was noted to have external hemorrhoidal tags, she had 3 polyps removed from the transverse colon 5 to 10 mm in size.  Path showed 2 tubular adenomas and 1 sessile serrated polyp with no dysplasia.  Also noted multiple left colon diverticuli.  She is indicated for 3-year interval follow-up.  No current concerns with abdominal pain, changes in bowel habits, no melena or hematochezia.  Review of Systems Pertinent positive and negative review of systems were noted in the above HPI section.  All other review of systems was otherwise negative.   Outpatient Encounter Medications as of 09/15/2022  Medication Sig   cetirizine (ZYRTEC) 10 MG tablet    clindamycin (CLEOCIN T) 1 % lotion SMARTSIG:Sparingly Topical Twice Daily PRN   EPINEPHrine 0.3 mg/0.3 mL IJ SOAJ injection See admin instructions.   fluticasone (FLONASE) 50 MCG/ACT nasal spray Place 2 sprays into the nose daily.   Olopatadine HCl 0.2 % SOLN 1 drop into affected eye   PRESCRIPTION MEDICATION Allergy shots-for pollen once a week   Allergy shots-for dust/mold once a week   tamoxifen (NOLVADEX) 20 MG tablet Take 0.5 tablets (10 mg total)  by mouth daily.   Turmeric 500 MG CAPS daily.   Vitamin D, Cholecalciferol, 25 MCG (1000 UT) CAPS daily.   pantoprazole (PROTONIX) 40 MG tablet Take 1 tablet (40 mg total) by mouth daily.   [DISCONTINUED] Fezolinetant (VEOZAH) 45 MG TABS Take 1 tablet (45 mg total) by mouth daily at 12 noon.   [DISCONTINUED] pantoprazole (PROTONIX) 40 MG tablet TAKE 1 TABLET(40 MG) BY MOUTH DAILY BEFORE AND SUPPER   [DISCONTINUED] pantoprazole (PROTONIX) 40 MG tablet Take 1 tablet (40 mg total) by mouth daily.   No facility-administered encounter medications on file as of 09/15/2022.   Allergies  Allergen Reactions   Other     Dust, pollen and Mold. Takes monthly shots for allergy.    Patient Active Problem List   Diagnosis Date Noted   Chronic allergic conjunctivitis 10/24/2020   Allergic rhinitis due to pollen 10/24/2020   Allergic rhinitis due to animal (cat) (dog) hair and dander 10/24/2020   Atypical lobular hyperplasia [facility]) of right breast 05/23/2019   Tenosynovitis of left foot 01/08/2016   Metatarsal stress fracture of left foot 12/06/2015   HEMORRHOIDS, INTERNAL 04/21/2007   ALLERGIC RHINITIS 04/21/2007   Social History   Socioeconomic History   Marital status: Married    Spouse name: Not on file   Number of children: Not on file   Years of education: Not on file   Highest education level: Not on file  Occupational History   Not on file  Tobacco Use   Smoking status: Never   Smokeless tobacco: Never  Vaping Use   Vaping status: Never Used  Substance and Sexual Activity   Alcohol use: Yes    Alcohol/week: 5.0 standard drinks of alcohol    Types: 5 Glasses of wine per week   Drug use: No   Sexual activity: Not on file  Other Topics Concern   Not on file  Social History Narrative   Not on file   Social Determinants of Health   Financial Resource Strain: Not on file  Food Insecurity: Not on file  Transportation Needs: Not on file  Physical Activity: Not on file  Stress:  Not on file  Social Connections: Not on file  Intimate Partner Violence: Not on file    Lindsay Blair family history includes Breast cancer (age of onset: 62) in her mother; Colon cancer (age of onset: 65) in her sister; Colon cancer (age of onset: 69) in her father; Colon polyps in her father.      Objective:    Vitals:   09/15/22 1101  BP: 122/68  Pulse: 68    Physical Exam Well-developed well-nourished older WF in no acute distress.  Height, Weight,161 BMI 27.6  HEENT; nontraumatic normocephalic, EOMI, PE R LA, sclera anicteric.  Extremities; no clubbing cyanosis or edema skin warm and dry Neuro/Psych; alert and oriented x4, grossly nonfocal mood and affect appropriate        Assessment & Plan:   #39 62 year old white female with history of chronic GERD, had been stable on Protonix 40 mg p.o. every morning AC.  Recently had run out of medication with persistent exacerbation of symptoms since.  Control of symptoms while on Protonix, no prior EGD  #2 personal history of adenomatous and sessile serrated polyps-last colonoscopy October 2022 indicated for 3-year interval follow-up October 2025. Family history of colon cancer in patient's father and sister  Plan; continue antireflux regimen. Refill Protonix 40 mg p.o. every morning AC breakfast #3011 refills Patient will be due for follow-up colonoscopy October 2025, consider EGD to be done at same time as colonoscopy. Patient will follow-up with Dr. Russella Dar or myself in the interim.  Gerome Kokesh Oswald Hillock PA-C 09/15/2022   Cc: Tisovec, Adelfa Koh, MD

## 2022-09-15 NOTE — Patient Instructions (Addendum)
_______________________________________________________  If your blood pressure at your visit was 140/90 or greater, please contact your primary care physician to follow up on this.  If you are age 61 or younger, your body mass index should be between 19-25. Your Body mass index is 27.64 kg/m. If this is out of the aformentioned range listed, please consider follow up with your Primary Care Provider.  ________________________________________________________  The [facility] providers would like to encourage you to use [facility] to communicate with providers for non-urgent requests or questions.  Due to long hold times on the telephone, sending your provider a message by [facility] may be a faster and more efficient way to get a response.  Please allow 48 business hours for a response.  Please remember that this is for non-urgent requests.  _______________________________________________________  We have sent the following medications to your pharmacy for you to pick up at your convenience:  Protonix 40mg  one tablet every morning before breakfast daily  Thank you for entrusting me with your care and choosing [facility].  Amy Esterwood, PA-C

## 2022-09-22 DIAGNOSIS — J3089 Other allergic rhinitis: Secondary | ICD-10-CM | POA: Diagnosis not present

## 2022-09-22 DIAGNOSIS — J301 Allergic rhinitis due to pollen: Secondary | ICD-10-CM | POA: Diagnosis not present

## 2022-09-22 DIAGNOSIS — J3081 Allergic rhinitis due to animal (cat) (dog) hair and dander: Secondary | ICD-10-CM | POA: Diagnosis not present

## 2022-09-28 DIAGNOSIS — J301 Allergic rhinitis due to pollen: Secondary | ICD-10-CM | POA: Diagnosis not present

## 2022-09-28 DIAGNOSIS — J3081 Allergic rhinitis due to animal (cat) (dog) hair and dander: Secondary | ICD-10-CM | POA: Diagnosis not present

## 2022-09-28 DIAGNOSIS — J3089 Other allergic rhinitis: Secondary | ICD-10-CM | POA: Diagnosis not present

## 2022-10-05 DIAGNOSIS — J3089 Other allergic rhinitis: Secondary | ICD-10-CM | POA: Diagnosis not present

## 2022-10-05 DIAGNOSIS — J301 Allergic rhinitis due to pollen: Secondary | ICD-10-CM | POA: Diagnosis not present

## 2022-10-05 DIAGNOSIS — J3081 Allergic rhinitis due to animal (cat) (dog) hair and dander: Secondary | ICD-10-CM | POA: Diagnosis not present

## 2022-10-14 IMAGING — US US BREAST*R* LIMITED INC AXILLA
1 series · 13 of 13 positions shown · non-contrast
Comparison: Previous exams including breast MRI dated 08/01/2020.

CLINICAL DATA: Recent breast MRI demonstrating 2 small
indeterminate enhancing masses within the upper outer and upper
central RIGHT breast. Patient presents today for targeted
second-look ultrasound for further characterization.

Family history of breast cancer, including patient's mother. History
of RIGHT breast CHEYNE/ALH in 7570 status post surgical excision.
EXAM:
ULTRASOUND OF THE RIGHT BREAST

[Series 1: us breast*right* limited inc axilla · 0.07mm/px · 13 of 13 slices shown]
[im 1/13]
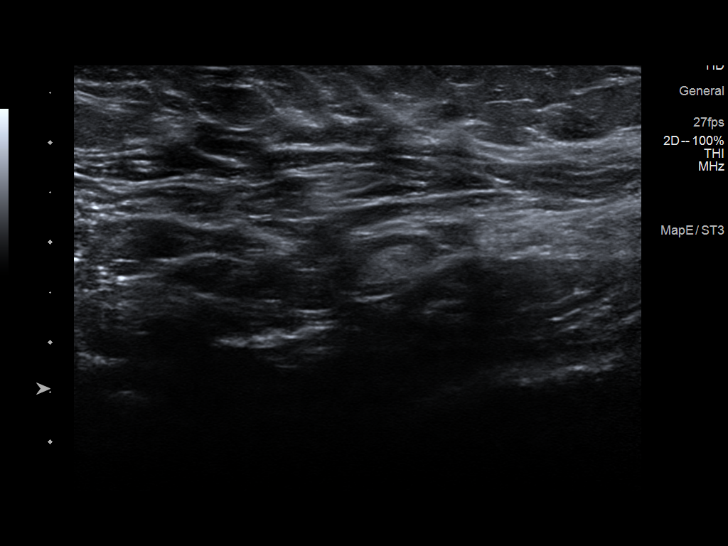
[im 2/13]
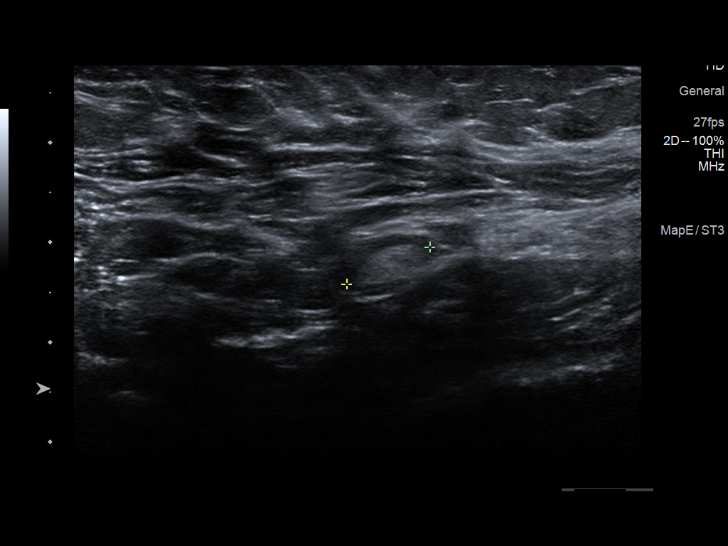
[im 3/13]
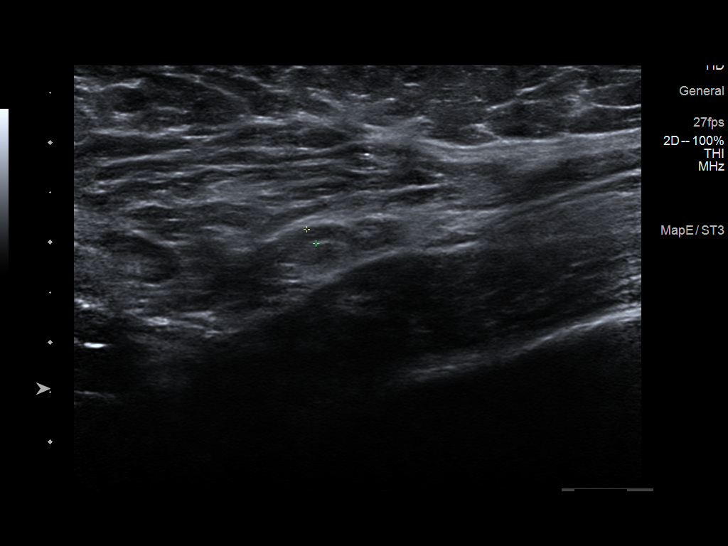
[im 4/13]
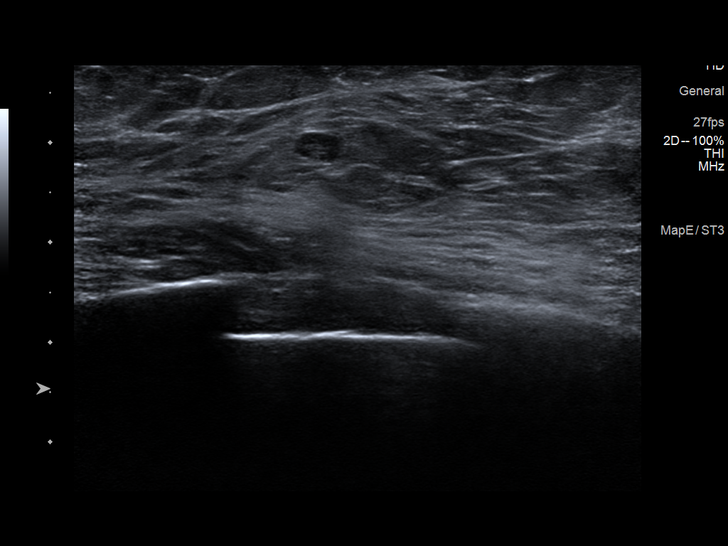
[im 5/13]
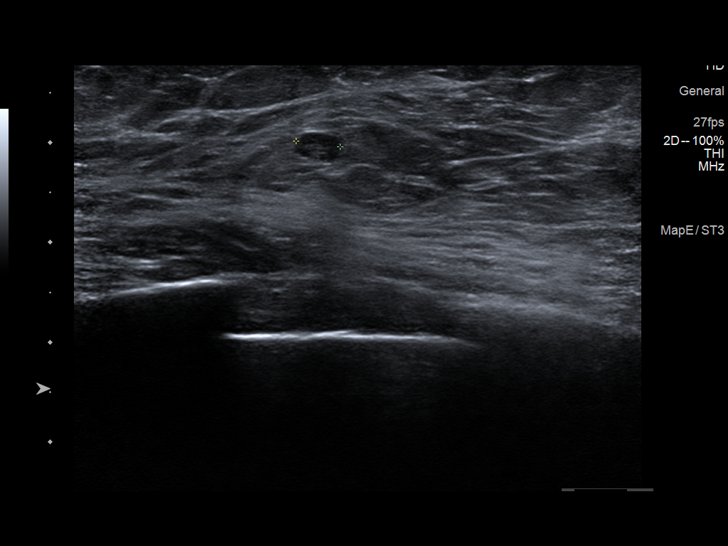
[im 6/13]
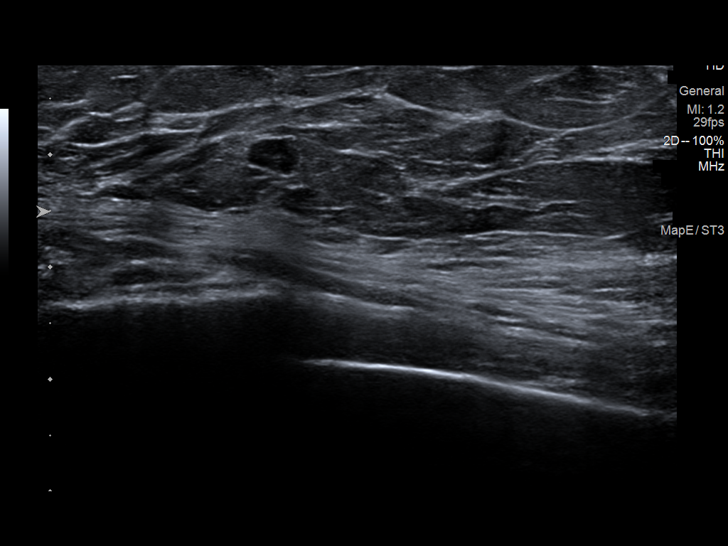
[im 7/13]
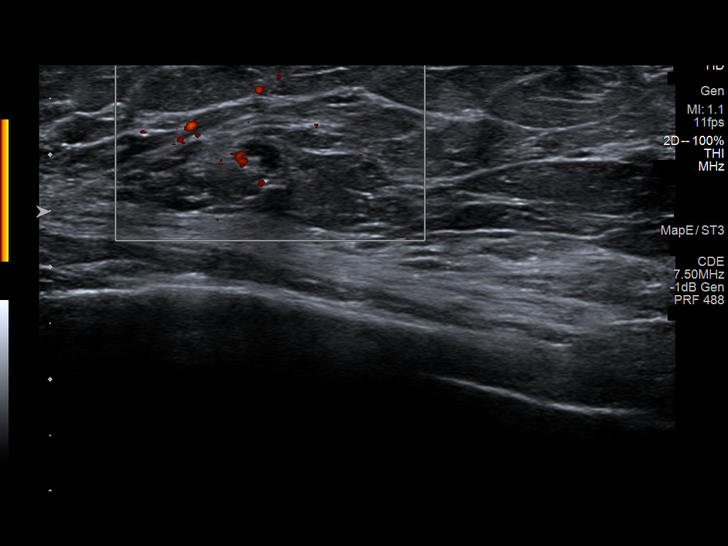
[im 8/13]
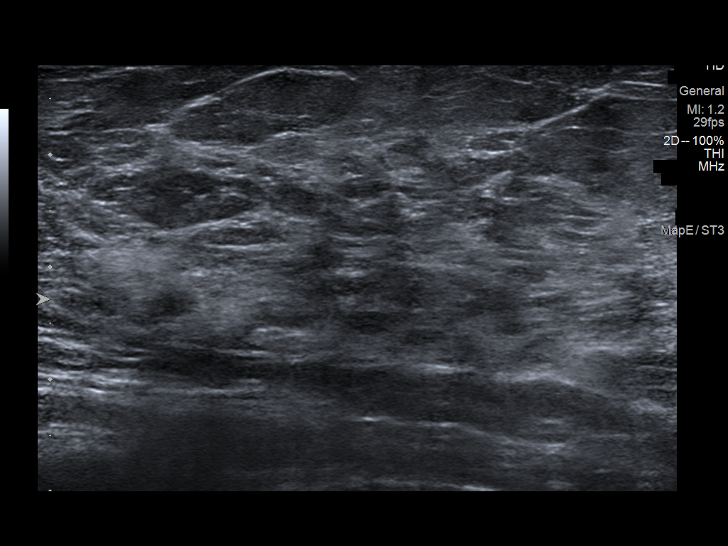
[im 9/13]
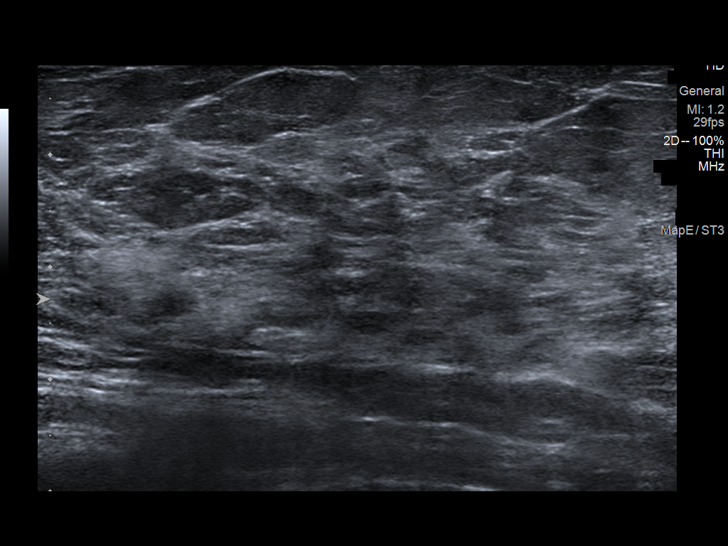
[im 10/13]
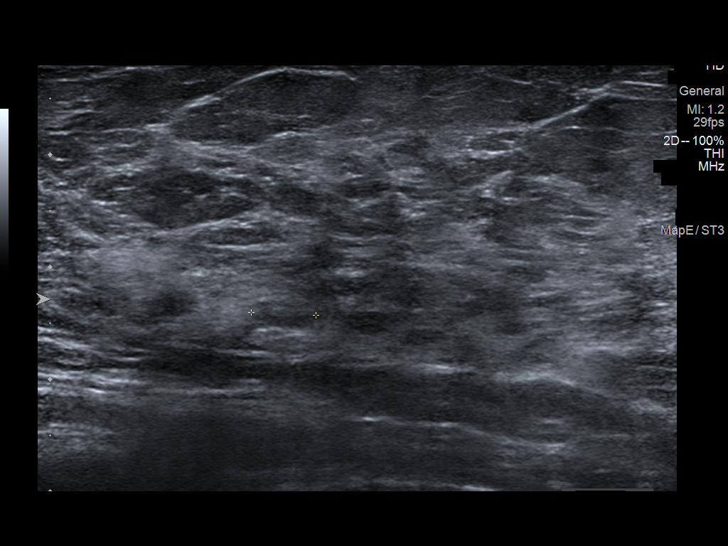
[im 11/13]
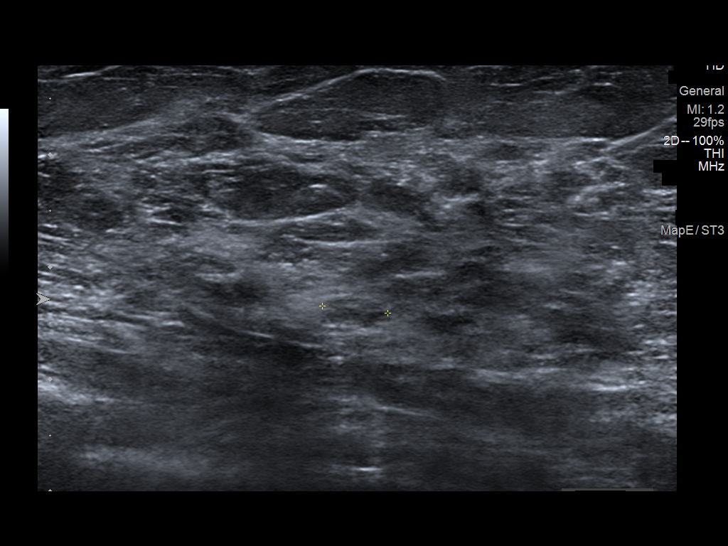
[im 12/13]
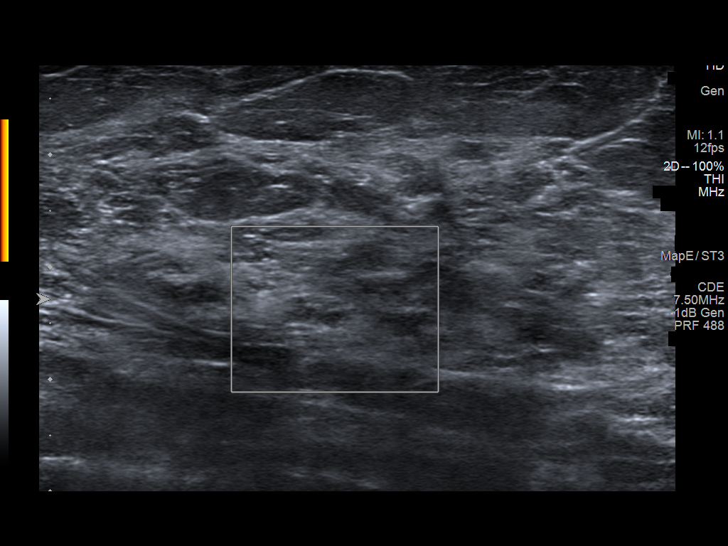
[im 13/13]
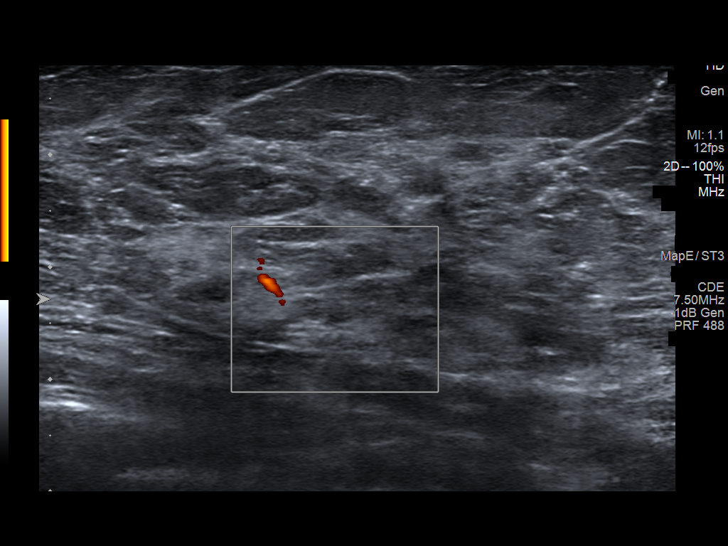

[13 of 13 positions shown; findings below may reference images not displayed]

FINDINGS: Targeted ultrasound is performed, showing a benign intramammary
lymph node in the RIGHT breast at the 9 o'clock axis, 13 cm from the
nipple, corresponding to 1 of the 2 enhancing masses seen on recent
breast MRI.

Additional benign intramammary lymph node is identified within the
RIGHT breast at the 10 o'clock axis, 15 cm from the nipple, possible
correlate for the second enhancing mass seen on recent MRI.

Additional oval hypoechoic area is seen within the RIGHT breast at
the 9 o'clock axis, 6 cm from the nipple, at posterior depth
limiting characterization, measuring 6 mm, additional possible
intramammary lymph node and additional possible correlate for the
second enhancing mass seen on recent MRI.

No suspicious solid or cystic mass is identified within the
upper-outer quadrant of the RIGHT breast or upper central RIGHT
breast by ultrasound, corresponding to the area of indeterminate
masses seen on recent MRI.
IMPRESSION: 1. Benign intramammary lymph node within the RIGHT breast at the 9
o'clock axis, 13 cm from the nipple, measuring 5 mm, corresponding
to 1 of the 2 enhancing masses seen on recent MRI.
2. Probably benign intramammary lymph node within the RIGHT breast
at the 9 o'clock axis, 6 cm from the nipple, at posterior depth,
measuring 6 mm, possible correlate for the second enhancing mass
seen on MRI.
3. Additional benign intramammary lymph node within the RIGHT breast
at the 10 o'clock axis, 15 cm from the nipple, also a possible
correlate for the second enhancing mass seen on MRI.
4. No suspicious solid or cystic mass is identified by ultrasound
today.

RECOMMENDATION:
1. Follow-up breast MRI in 6 months to ensure stability of the MRI
appearance.
2. Follow-up RIGHT breast diagnostic mammogram and ultrasound in 6
months to ensure stability of today's benign appearance.

I have discussed the findings and recommendations with the patient.
If applicable, a reminder letter will be sent to the patient
regarding the next appointment.

BI-RADS CATEGORY  3: Probably benign.

## 2022-11-03 DIAGNOSIS — L700 Acne vulgaris: Secondary | ICD-10-CM | POA: Diagnosis not present

## 2022-11-03 DIAGNOSIS — L814 Other melanin hyperpigmentation: Secondary | ICD-10-CM | POA: Diagnosis not present

## 2022-11-03 DIAGNOSIS — J3089 Other allergic rhinitis: Secondary | ICD-10-CM | POA: Diagnosis not present

## 2022-11-03 DIAGNOSIS — J3081 Allergic rhinitis due to animal (cat) (dog) hair and dander: Secondary | ICD-10-CM | POA: Diagnosis not present

## 2022-11-03 DIAGNOSIS — L821 Other seborrheic keratosis: Secondary | ICD-10-CM | POA: Diagnosis not present

## 2022-11-03 DIAGNOSIS — D225 Melanocytic nevi of trunk: Secondary | ICD-10-CM | POA: Diagnosis not present

## 2022-11-03 DIAGNOSIS — J301 Allergic rhinitis due to pollen: Secondary | ICD-10-CM | POA: Diagnosis not present

## 2022-11-09 DIAGNOSIS — J3089 Other allergic rhinitis: Secondary | ICD-10-CM | POA: Diagnosis not present

## 2022-11-09 DIAGNOSIS — J301 Allergic rhinitis due to pollen: Secondary | ICD-10-CM | POA: Diagnosis not present

## 2022-11-09 DIAGNOSIS — J3081 Allergic rhinitis due to animal (cat) (dog) hair and dander: Secondary | ICD-10-CM | POA: Diagnosis not present

## 2022-11-12 ENCOUNTER — Other Ambulatory Visit: Payer: Self-pay | Admitting: Hematology and Oncology

## 2022-11-16 DIAGNOSIS — J301 Allergic rhinitis due to pollen: Secondary | ICD-10-CM | POA: Diagnosis not present

## 2022-11-16 DIAGNOSIS — J3081 Allergic rhinitis due to animal (cat) (dog) hair and dander: Secondary | ICD-10-CM | POA: Diagnosis not present

## 2022-11-16 DIAGNOSIS — J3089 Other allergic rhinitis: Secondary | ICD-10-CM | POA: Diagnosis not present

## 2022-11-24 DIAGNOSIS — J301 Allergic rhinitis due to pollen: Secondary | ICD-10-CM | POA: Diagnosis not present

## 2022-11-24 DIAGNOSIS — J3089 Other allergic rhinitis: Secondary | ICD-10-CM | POA: Diagnosis not present

## 2022-11-24 DIAGNOSIS — J3081 Allergic rhinitis due to animal (cat) (dog) hair and dander: Secondary | ICD-10-CM | POA: Diagnosis not present

## 2022-11-30 DIAGNOSIS — J3089 Other allergic rhinitis: Secondary | ICD-10-CM | POA: Diagnosis not present

## 2022-11-30 DIAGNOSIS — J3081 Allergic rhinitis due to animal (cat) (dog) hair and dander: Secondary | ICD-10-CM | POA: Diagnosis not present

## 2022-11-30 DIAGNOSIS — J301 Allergic rhinitis due to pollen: Secondary | ICD-10-CM | POA: Diagnosis not present

## 2022-12-07 DIAGNOSIS — J3089 Other allergic rhinitis: Secondary | ICD-10-CM | POA: Diagnosis not present

## 2022-12-07 DIAGNOSIS — J3081 Allergic rhinitis due to animal (cat) (dog) hair and dander: Secondary | ICD-10-CM | POA: Diagnosis not present

## 2022-12-07 DIAGNOSIS — J301 Allergic rhinitis due to pollen: Secondary | ICD-10-CM | POA: Diagnosis not present

## 2022-12-09 DIAGNOSIS — J301 Allergic rhinitis due to pollen: Secondary | ICD-10-CM | POA: Diagnosis not present

## 2022-12-09 DIAGNOSIS — J3089 Other allergic rhinitis: Secondary | ICD-10-CM | POA: Diagnosis not present

## 2022-12-09 DIAGNOSIS — J3081 Allergic rhinitis due to animal (cat) (dog) hair and dander: Secondary | ICD-10-CM | POA: Diagnosis not present

## 2022-12-14 DIAGNOSIS — J3081 Allergic rhinitis due to animal (cat) (dog) hair and dander: Secondary | ICD-10-CM | POA: Diagnosis not present

## 2022-12-14 DIAGNOSIS — J3089 Other allergic rhinitis: Secondary | ICD-10-CM | POA: Diagnosis not present

## 2022-12-14 DIAGNOSIS — J301 Allergic rhinitis due to pollen: Secondary | ICD-10-CM | POA: Diagnosis not present

## 2022-12-28 DIAGNOSIS — J3081 Allergic rhinitis due to animal (cat) (dog) hair and dander: Secondary | ICD-10-CM | POA: Diagnosis not present

## 2022-12-28 DIAGNOSIS — J3089 Other allergic rhinitis: Secondary | ICD-10-CM | POA: Diagnosis not present

## 2022-12-28 DIAGNOSIS — J301 Allergic rhinitis due to pollen: Secondary | ICD-10-CM | POA: Diagnosis not present

## 2023-01-04 DIAGNOSIS — J3081 Allergic rhinitis due to animal (cat) (dog) hair and dander: Secondary | ICD-10-CM | POA: Diagnosis not present

## 2023-01-04 DIAGNOSIS — J3089 Other allergic rhinitis: Secondary | ICD-10-CM | POA: Diagnosis not present

## 2023-01-04 DIAGNOSIS — J301 Allergic rhinitis due to pollen: Secondary | ICD-10-CM | POA: Diagnosis not present

## 2023-01-11 DIAGNOSIS — J3081 Allergic rhinitis due to animal (cat) (dog) hair and dander: Secondary | ICD-10-CM | POA: Diagnosis not present

## 2023-01-11 DIAGNOSIS — J301 Allergic rhinitis due to pollen: Secondary | ICD-10-CM | POA: Diagnosis not present

## 2023-01-11 DIAGNOSIS — J3089 Other allergic rhinitis: Secondary | ICD-10-CM | POA: Diagnosis not present

## 2023-01-18 DIAGNOSIS — J301 Allergic rhinitis due to pollen: Secondary | ICD-10-CM | POA: Diagnosis not present

## 2023-01-18 DIAGNOSIS — J3081 Allergic rhinitis due to animal (cat) (dog) hair and dander: Secondary | ICD-10-CM | POA: Diagnosis not present

## 2023-01-18 DIAGNOSIS — J3089 Other allergic rhinitis: Secondary | ICD-10-CM | POA: Diagnosis not present

## 2023-01-19 ENCOUNTER — Telehealth: Payer: Self-pay | Admitting: Physician Assistant

## 2023-01-19 NOTE — Telephone Encounter (Signed)
Patient returned call and stated she would continue with pantoprazole 40mg  one tablet daily.

## 2023-01-19 NOTE — Telephone Encounter (Addendum)
Left message on patients voicemail with Amy's recommendations,   "There will not be a PPI that will be a similar strength cheaper than $17 per month she could try getting generic omeprazole but the OTC strength is 10 mg, she may need to p.o. twice daily."   Advised that patient should call our office back to let us know which medication she chooses to take as we will need to have the information correct in our computer system.

## 2023-01-19 NOTE — Telephone Encounter (Signed)
Spoke with Angelique Blonder at [facility] who stated that the patient has a 90 quantity limitation which has been used.  Angelique Blonder ran the prescription with a discount card and said the total would be $17.18 for a 1 month supply.   Patient would like to purchase a PPI that is OTC as she thinks it will be cheaper and more cost effective.    Please advise which PPI she should use.

## 2023-01-19 NOTE — Telephone Encounter (Signed)
Patient called and stated that pharmacy [facility] will not let her refill her medication Pantoprazole. Patient stated as well that pharmacy told her she is not eligiable for refill until 09/15/2023. Please advise.

## 2023-01-25 DIAGNOSIS — J301 Allergic rhinitis due to pollen: Secondary | ICD-10-CM | POA: Diagnosis not present

## 2023-01-25 DIAGNOSIS — J3089 Other allergic rhinitis: Secondary | ICD-10-CM | POA: Diagnosis not present

## 2023-01-25 DIAGNOSIS — L814 Other melanin hyperpigmentation: Secondary | ICD-10-CM | POA: Diagnosis not present

## 2023-01-25 DIAGNOSIS — J3081 Allergic rhinitis due to animal (cat) (dog) hair and dander: Secondary | ICD-10-CM | POA: Diagnosis not present

## 2023-01-25 DIAGNOSIS — D225 Melanocytic nevi of trunk: Secondary | ICD-10-CM | POA: Diagnosis not present

## 2023-01-25 DIAGNOSIS — L821 Other seborrheic keratosis: Secondary | ICD-10-CM | POA: Diagnosis not present

## 2023-02-05 DIAGNOSIS — J3089 Other allergic rhinitis: Secondary | ICD-10-CM | POA: Diagnosis not present

## 2023-02-05 DIAGNOSIS — J301 Allergic rhinitis due to pollen: Secondary | ICD-10-CM | POA: Diagnosis not present

## 2023-02-05 DIAGNOSIS — J3081 Allergic rhinitis due to animal (cat) (dog) hair and dander: Secondary | ICD-10-CM | POA: Diagnosis not present

## 2023-02-08 DIAGNOSIS — J3081 Allergic rhinitis due to animal (cat) (dog) hair and dander: Secondary | ICD-10-CM | POA: Diagnosis not present

## 2023-02-08 DIAGNOSIS — J301 Allergic rhinitis due to pollen: Secondary | ICD-10-CM | POA: Diagnosis not present

## 2023-02-09 DIAGNOSIS — J3089 Other allergic rhinitis: Secondary | ICD-10-CM | POA: Diagnosis not present

## 2023-04-18 IMAGING — MG DIGITAL DIAGNOSTIC BILAT W/ TOMO W/ CAD
8 series · 8 of 24 positions shown · non-contrast
Comparison: Previous exam(s).

CLINICAL DATA: 60-year-old female for six-month follow-up of
probable benign RIGHT breast masses/intramammary lymph nodes.

EXAM:
DIGITAL DIAGNOSTIC BILATERAL MAMMOGRAM WITH TOMOSYNTHESIS AND CAD;
ULTRASOUND RIGHT BREAST LIMITED
TECHNIQUE: Bilateral digital diagnostic mammography and breast tomosynthesis
was performed. The images were evaluated with computer-aided
detection.; Targeted ultrasound examination of the right breast was
performed

[R MLO synth-2D]
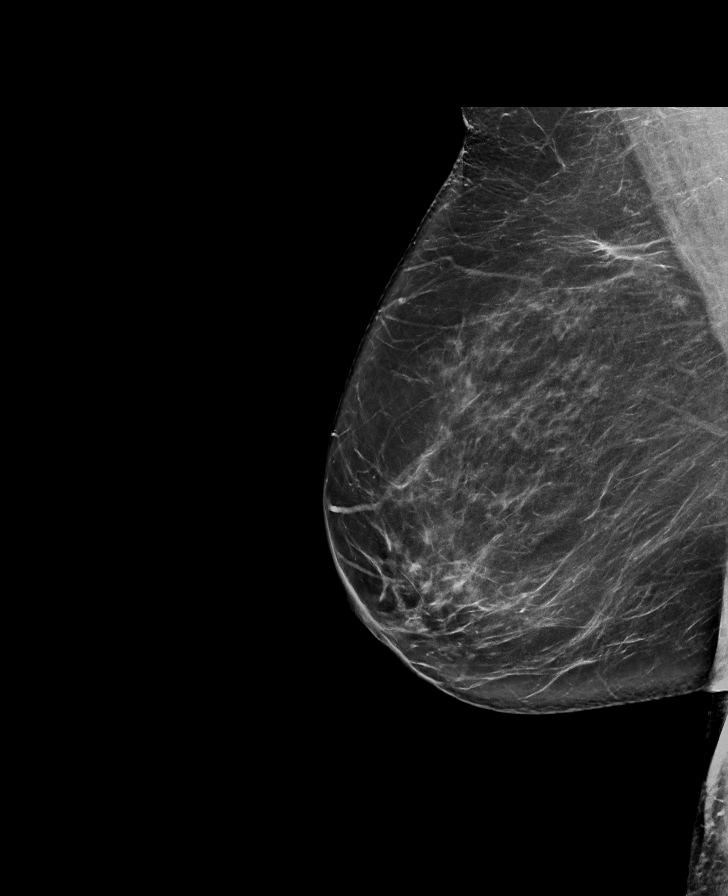

[L MLO synth-2D]
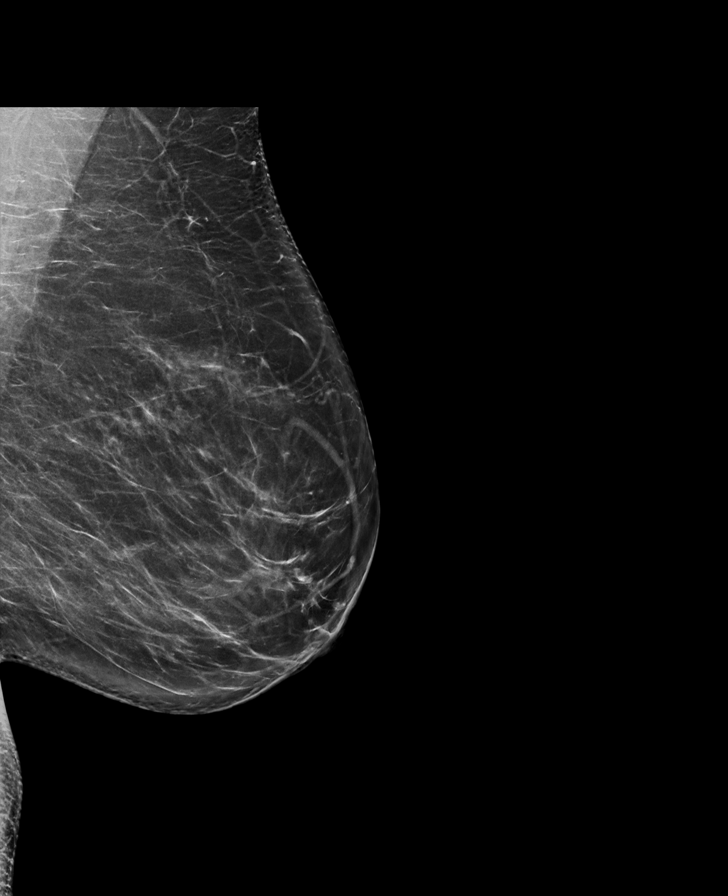

[L CC synth-2D]
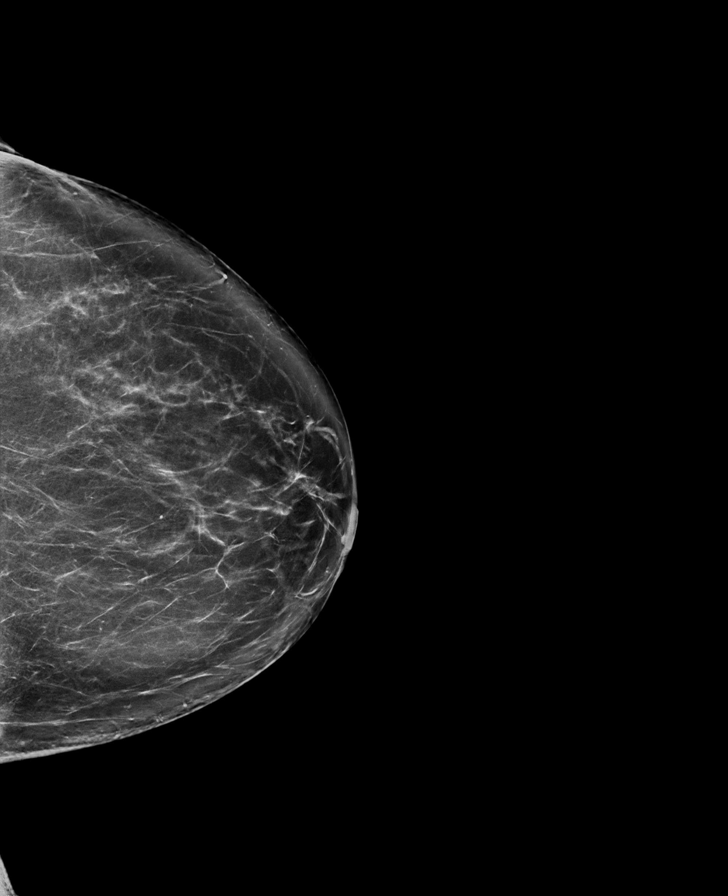

[R CC synth-2D]
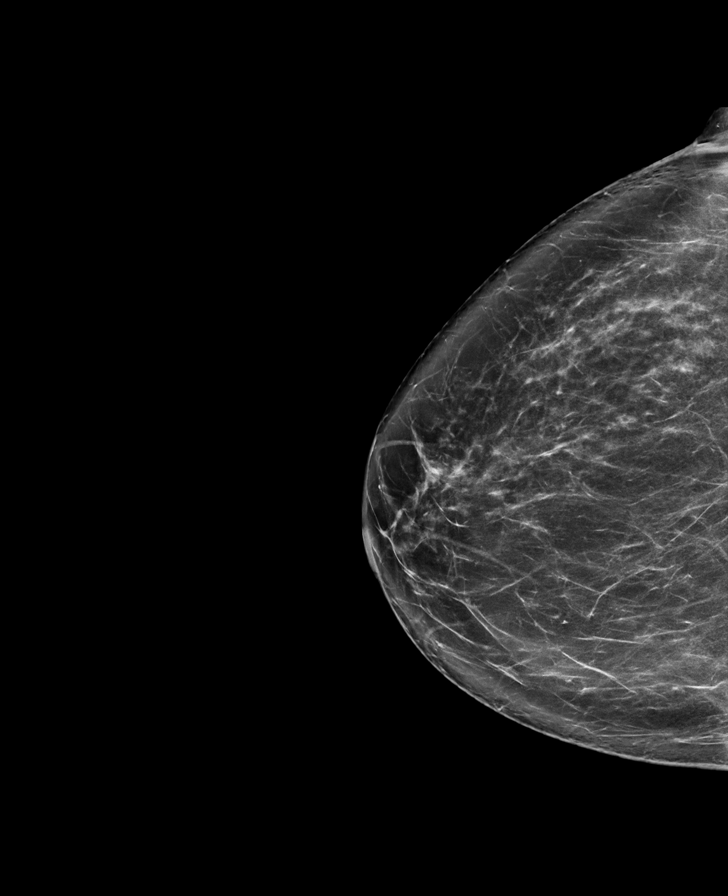

[L CC tomo · tomo slice 43/85.0]
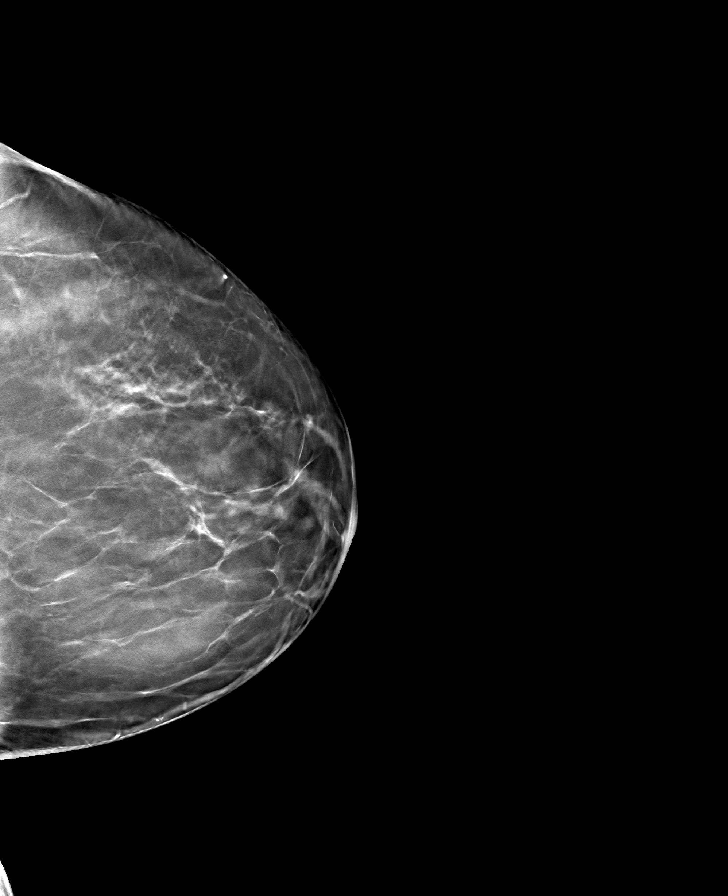

[L MLO tomo · tomo slice 43/85.0]
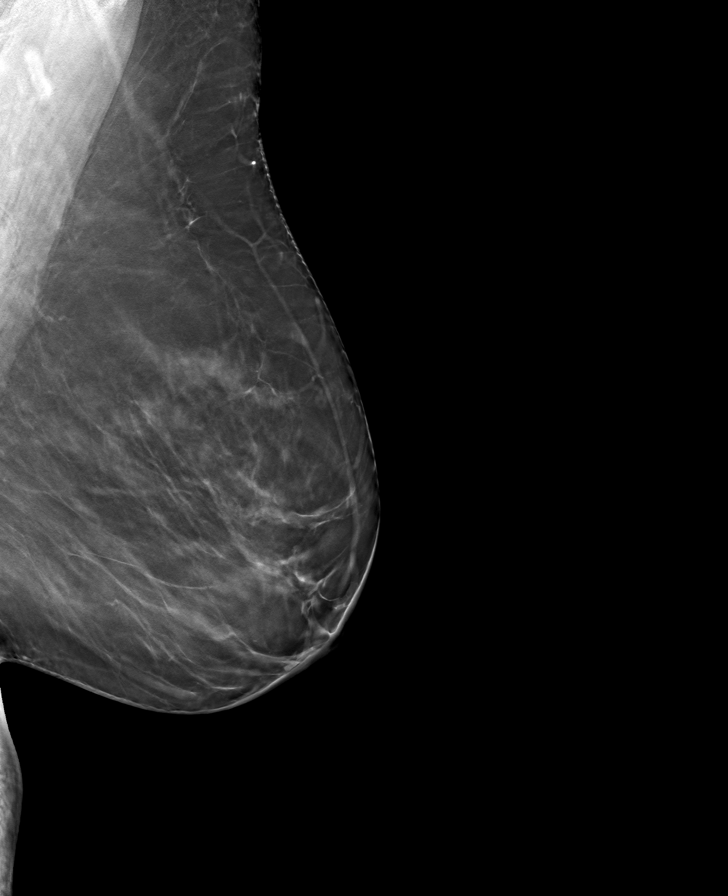

[R MLO tomo · tomo slice 45/88.0]
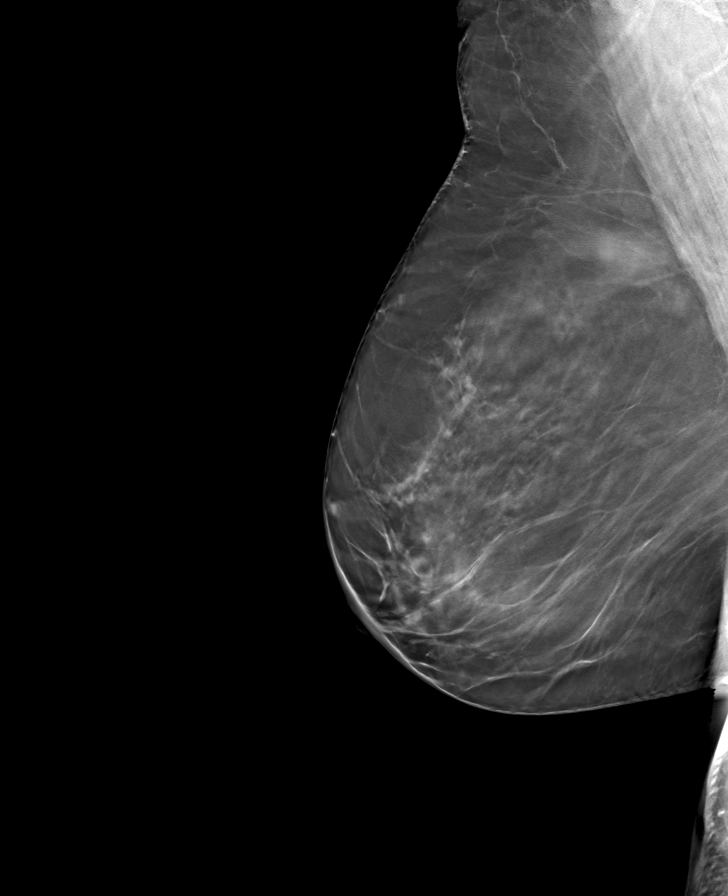

[R CC tomo · tomo slice 41/82.0]
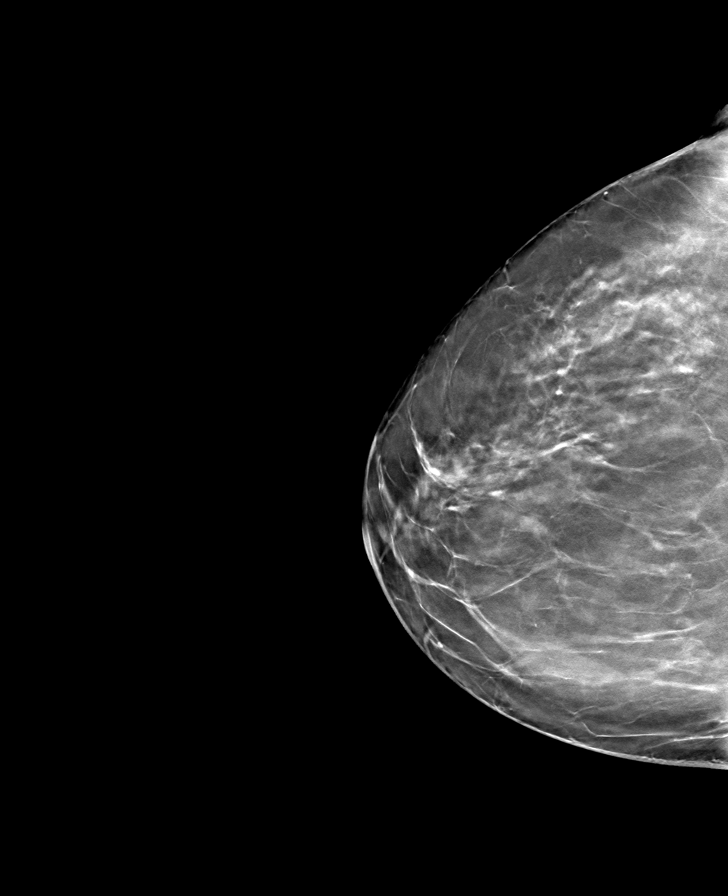

[8 of 24 positions shown; findings below may reference images not displayed]

ACR Breast Density Category b: There are scattered areas of
fibroglandular density.
FINDINGS: 2D/3D full field views of both breasts demonstrate no suspicious
mass, distortion or worrisome calcifications.

Targeted ultrasound is performed, showing unchanged benign
intraparenchymal lymph nodes at the 9 and 10 o'clock positions of
the RIGHT breast. No new or suspicious findings are noted within the
OUTER RIGHT breast.
IMPRESSION: 1. Unchanged benign intraparenchymal lymph nodes within the OUTER
RIGHT breast. No further imaging follow-up recommended.
2. No mammographic evidence of breast malignancy.

RECOMMENDATION:
Bilateral screening mammogram in 1 year.

Annual bilateral screening breast MRI in 6 months.

I have discussed the findings and recommendations with the patient.
If applicable, a reminder letter will be sent to the patient
regarding the next appointment.

BI-RADS CATEGORY  2: Benign.

## 2023-09-06 ENCOUNTER — Ambulatory Visit: Payer: 59 | Admitting: Hematology and Oncology

## 2023-10-12 ENCOUNTER — Other Ambulatory Visit: Payer: Self-pay | Admitting: Physician Assistant

## 2023-10-25 ENCOUNTER — Other Ambulatory Visit: Payer: Self-pay | Admitting: Physician Assistant
